# Patient Record
Sex: Male | Born: 1945 | Race: White | Hispanic: No | Marital: Married | Smoking: Never smoker
Health system: Southern US, Community
[De-identification: ages and names within clinical notes are randomized; demographics above are authoritative.]

## PROBLEM LIST (undated history)

## (undated) DIAGNOSIS — I10 Essential (primary) hypertension: Secondary | ICD-10-CM

## (undated) DIAGNOSIS — Z8619 Personal history of other infectious and parasitic diseases: Secondary | ICD-10-CM

## (undated) DIAGNOSIS — H33319 Horseshoe tear of retina without detachment, unspecified eye: Secondary | ICD-10-CM

## (undated) DIAGNOSIS — E785 Hyperlipidemia, unspecified: Secondary | ICD-10-CM

## (undated) DIAGNOSIS — N529 Male erectile dysfunction, unspecified: Secondary | ICD-10-CM

## (undated) DIAGNOSIS — T7840XA Allergy, unspecified, initial encounter: Secondary | ICD-10-CM

## (undated) DIAGNOSIS — H439 Unspecified disorder of vitreous body: Secondary | ICD-10-CM

## (undated) DIAGNOSIS — K219 Gastro-esophageal reflux disease without esophagitis: Secondary | ICD-10-CM

## (undated) HISTORY — PX: OTHER SURGICAL HISTORY: SHX169

## (undated) HISTORY — DX: Horseshoe tear of retina without detachment, unspecified eye: H33.319

## (undated) HISTORY — DX: Personal history of other infectious and parasitic diseases: Z86.19

## (undated) HISTORY — DX: Gastro-esophageal reflux disease without esophagitis: K21.9

## (undated) HISTORY — DX: Male erectile dysfunction, unspecified: N52.9

## (undated) HISTORY — DX: Hyperlipidemia, unspecified: E78.5

## (undated) HISTORY — DX: Allergy, unspecified, initial encounter: T78.40XA

## (undated) HISTORY — PX: EYE SURGERY: SHX253

## (undated) HISTORY — DX: Unspecified disorder of vitreous body: H43.9

## (undated) HISTORY — DX: Essential (primary) hypertension: I10

---

## 2006-12-13 ENCOUNTER — Ambulatory Visit: Payer: Self-pay | Admitting: Internal Medicine

## 2006-12-13 LAB — CONVERTED CEMR LAB
AST: 22 units/L (ref 0–37)
Albumin: 3.9 g/dL (ref 3.5–5.2)
Basophils Relative: 0.7 % (ref 0.0–1.0)
Bilirubin, Direct: 0.1 mg/dL (ref 0.0–0.3)
CO2: 25 meq/L (ref 19–32)
Calcium: 9.1 mg/dL (ref 8.4–10.5)
Cholesterol: 206 mg/dL (ref 0–200)
Eosinophils Absolute: 0.2 10*3/uL (ref 0.0–0.6)
GFR calc non Af Amer: 91 mL/min
HDL: 34.6 mg/dL — ABNORMAL LOW (ref >39.0)
Ketones, ur: NEGATIVE mg/dL
Leukocytes, UA: NEGATIVE
Lymphocytes Relative: 29.9 % (ref 12.0–46.0)
Monocytes Absolute: 0.4 10*3/uL (ref 0.2–0.7)
Monocytes Relative: 5.9 % (ref 3.0–11.0)
Neutro Abs: 4 10*3/uL (ref 1.4–7.7)
Neutrophils Relative %: 61 % (ref 43.0–77.0)
Nitrite: NEGATIVE
PSA: 1.04 ng/mL (ref 0.10–4.00)
Platelets: 253 10*3/uL (ref 150–400)
Sodium: 140 meq/L (ref 135–145)
TSH: 2.2 microintl units/mL (ref 0.35–5.50)
Total CHOL/HDL Ratio: 6
Total Protein: 7.2 g/dL (ref 6.0–8.3)
Triglycerides: 139 mg/dL (ref 0–149)
Urine Glucose: NEGATIVE mg/dL
Urobilinogen, UA: 0.2 (ref 0.0–1.0)
WBC: 6.6 10*3/uL (ref 4.5–10.5)

## 2006-12-16 ENCOUNTER — Ambulatory Visit: Payer: Self-pay | Admitting: Internal Medicine

## 2006-12-16 DIAGNOSIS — J309 Allergic rhinitis, unspecified: Secondary | ICD-10-CM | POA: Insufficient documentation

## 2006-12-16 DIAGNOSIS — I10 Essential (primary) hypertension: Secondary | ICD-10-CM

## 2006-12-16 DIAGNOSIS — R05 Cough: Secondary | ICD-10-CM

## 2006-12-16 DIAGNOSIS — E785 Hyperlipidemia, unspecified: Secondary | ICD-10-CM | POA: Insufficient documentation

## 2006-12-16 DIAGNOSIS — R197 Diarrhea, unspecified: Secondary | ICD-10-CM

## 2006-12-16 DIAGNOSIS — K219 Gastro-esophageal reflux disease without esophagitis: Secondary | ICD-10-CM

## 2006-12-17 ENCOUNTER — Encounter: Payer: Self-pay | Admitting: Internal Medicine

## 2007-01-06 ENCOUNTER — Ambulatory Visit: Payer: Self-pay | Admitting: Internal Medicine

## 2007-02-27 ENCOUNTER — Encounter: Payer: Self-pay | Admitting: Infectious Diseases

## 2007-03-15 ENCOUNTER — Ambulatory Visit: Payer: Self-pay | Admitting: Gastroenterology

## 2007-05-31 ENCOUNTER — Telehealth: Payer: Self-pay | Admitting: Internal Medicine

## 2008-05-30 ENCOUNTER — Ambulatory Visit: Payer: Self-pay | Admitting: Internal Medicine

## 2008-05-30 DIAGNOSIS — E538 Deficiency of other specified B group vitamins: Secondary | ICD-10-CM | POA: Insufficient documentation

## 2008-05-30 DIAGNOSIS — N529 Male erectile dysfunction, unspecified: Secondary | ICD-10-CM | POA: Insufficient documentation

## 2008-05-30 DIAGNOSIS — E559 Vitamin D deficiency, unspecified: Secondary | ICD-10-CM | POA: Insufficient documentation

## 2008-05-30 LAB — CONVERTED CEMR LAB
ALT: 24 units/L (ref 0–53)
AST: 20 units/L (ref 0–37)
Albumin: 4.2 g/dL (ref 3.5–5.2)
Alkaline Phosphatase: 63 units/L (ref 39–117)
BUN: 14 mg/dL (ref 6–23)
Basophils Absolute: 0.1 10*3/uL (ref 0.0–0.1)
Basophils Relative: 0.7 % (ref 0.0–3.0)
Bilirubin Urine: NEGATIVE
Bilirubin, Direct: 0.1 mg/dL (ref 0.0–0.3)
CO2: 29 meq/L (ref 19–32)
Calcium: 9.7 mg/dL (ref 8.4–10.5)
Chloride: 102 meq/L (ref 96–112)
Cholesterol: 210 mg/dL — ABNORMAL HIGH (ref 0–200)
Creatinine, Ser: 0.9 mg/dL (ref 0.4–1.5)
Direct LDL: 152.7 mg/dL
Eosinophils Absolute: 0.2 10*3/uL (ref 0.0–0.7)
Eosinophils Relative: 2.1 % (ref 0.0–5.0)
GFR calc non Af Amer: 90.89 mL/min (ref >60)
Glucose, Bld: 92 mg/dL (ref 70–99)
HCT: 46.5 % (ref 39.0–52.0)
HDL: 37.5 mg/dL — ABNORMAL LOW (ref >39.00)
Hemoglobin, Urine: NEGATIVE
Hemoglobin: 16 g/dL (ref 13.0–17.0)
Ketones, ur: NEGATIVE mg/dL
Leukocytes, UA: NEGATIVE
Lymphocytes Relative: 25.1 % (ref 12.0–46.0)
Lymphs Abs: 1.9 10*3/uL (ref 0.7–4.0)
MCHC: 34.4 g/dL (ref 30.0–36.0)
MCV: 91.5 fL (ref 78.0–100.0)
Monocytes Absolute: 0.4 10*3/uL (ref 0.1–1.0)
Monocytes Relative: 5.5 % (ref 3.0–12.0)
Neutro Abs: 5 10*3/uL (ref 1.4–7.7)
Neutrophils Relative %: 66.6 % (ref 43.0–77.0)
Nitrite: NEGATIVE
PSA: 1.55 ng/mL (ref 0.10–4.00)
Platelets: 269 10*3/uL (ref 150.0–400.0)
Potassium: 4.3 meq/L (ref 3.5–5.1)
RBC: 5.08 M/uL (ref 4.22–5.81)
RDW: 12 % (ref 11.5–14.6)
Sodium: 139 meq/L (ref 135–145)
Specific Gravity, Urine: 1.015 (ref 1.000–1.030)
TSH: 2.72 microintl units/mL (ref 0.35–5.50)
Testosterone: 271.56 ng/dL — ABNORMAL LOW (ref 350.00–890.00)
Total Bilirubin: 1 mg/dL (ref 0.3–1.2)
Total CHOL/HDL Ratio: 6
Total Protein, Urine: NEGATIVE mg/dL
Total Protein: 7.9 g/dL (ref 6.0–8.3)
Triglycerides: 147 mg/dL (ref 0.0–149.0)
Urine Glucose: NEGATIVE mg/dL
Urobilinogen, UA: 0.2 (ref 0.0–1.0)
VLDL: 29.4 mg/dL (ref 0.0–40.0)
Vitamin B-12: 223 pg/mL (ref 211–911)
WBC: 7.6 10*3/uL (ref 4.5–10.5)
pH: 6.5 (ref 5.0–8.0)

## 2008-06-10 LAB — CONVERTED CEMR LAB: Vit D, 25-Hydroxy: 28 ng/mL — ABNORMAL LOW (ref 30–89)

## 2009-09-09 ENCOUNTER — Encounter: Payer: Self-pay | Admitting: Internal Medicine

## 2009-09-18 ENCOUNTER — Ambulatory Visit: Payer: Self-pay | Admitting: Internal Medicine

## 2009-09-18 DIAGNOSIS — L219 Seborrheic dermatitis, unspecified: Secondary | ICD-10-CM | POA: Insufficient documentation

## 2009-09-19 ENCOUNTER — Ambulatory Visit: Payer: Self-pay | Admitting: Internal Medicine

## 2009-09-24 LAB — CONVERTED CEMR LAB
BUN: 11 mg/dL (ref 6–23)
Basophils Absolute: 0 10*3/uL (ref 0.0–0.1)
Bilirubin Urine: NEGATIVE
Cholesterol: 194 mg/dL (ref 0–200)
Creatinine, Ser: 0.8 mg/dL (ref 0.4–1.5)
Eosinophils Absolute: 0.1 10*3/uL (ref 0.0–0.7)
GFR calc non Af Amer: 102.2 mL/min (ref >60)
Glucose, Bld: 84 mg/dL (ref 70–99)
Hemoglobin: 15.1 g/dL (ref 13.0–17.0)
Ketones, ur: NEGATIVE mg/dL
Leukocytes, UA: NEGATIVE
Lymphocytes Relative: 30.3 % (ref 12.0–46.0)
Lymphs Abs: 1.8 10*3/uL (ref 0.7–4.0)
Monocytes Relative: 7.3 % (ref 3.0–12.0)
Neutrophils Relative %: 59.2 % (ref 43.0–77.0)
Nitrite: NEGATIVE
PSA: 1.29 ng/mL (ref 0.10–4.00)
Platelets: 243 10*3/uL (ref 150.0–400.0)
RDW: 13.2 % (ref 11.5–14.6)
Sodium: 141 meq/L (ref 135–145)
Specific Gravity, Urine: 1.025 (ref 1.000–1.030)
Total CHOL/HDL Ratio: 5
Total Protein: 6.6 g/dL (ref 6.0–8.3)
Triglycerides: 99 mg/dL (ref 0.0–149.0)
Urine Glucose: NEGATIVE mg/dL
Urobilinogen, UA: 0.2 (ref 0.0–1.0)
Vitamin B-12: 288 pg/mL (ref 211–911)
pH: 5.5 (ref 5.0–8.0)

## 2009-11-27 ENCOUNTER — Encounter: Payer: Self-pay | Admitting: Internal Medicine

## 2010-02-24 NOTE — Letter (Signed)
Summary: Primary Care Appointment Letter  [facility]  [address] [place], [place] [address]   Phone: [phone]  Fax: [phone]    09/09/2009 MRN: [id number]  Roger Owens [address] [place], [place]  [address]  Dear Roger Owens,   Your Primary Care Physician Tresa Garter MD has indicated that:    ___X____it is time to schedule an appointment with Dr. Posey Rea.  Please call the office.    _______you missed your appointment on______ and need to call and          reschedule.    _______you need to have lab work done.    _______you need to schedule an appointment discuss lab or test results.    _______you need to call to reschedule your appointment that is                       scheduled on _________.     Please call our office as soon as possible. Our phone number is [phone]. Please press option 1. Our office is open 8a-12noon and 1p-5p, Monday through Friday.     Thank you,    [facility]

## 2010-02-24 NOTE — Letter (Signed)
Summary: Immunization/Shot Record  [facility]  [address] [place], [place] [address]   Phone: [phone]  Fax: [phone]     Immunization Record for: Shizuo Julia   Flu Vaccine Consent Questions     Do you have a history of severe allergic reactions to this vaccine? no    Any prior history of allergic reactions to egg and/or gelatin? no    Do you have a sensitivity to the preservative Thimersol? no    Do you have a past history of Guillan-Barre Syndrome? no    Do you currently have an acute febrile illness? no    Have you ever had a severe reaction to latex? no    Vaccine information given and explained to patient? yes    Are you currently pregnant? no    Lot Number:[id number]   Exp Date:07/25/2010   Site Given  Right Deltoid IM Lanier Prude, [facility])  September 18, 2009 10:50 AM

## 2010-02-24 NOTE — Assessment & Plan Note (Signed)
Summary: FU PER FLAG/NWS   Vital Signs:  Patient profile:   65 year old male Height:      70 inches Weight:      207 pounds BMI:     29.81 Temp:     98.2 degrees F oral Pulse rate:   64 / minute Resp:     16 per minute BP sitting:   120 / 80  (left arm) Cuff size:   regular  Vitals Entered By: Lanier Prude, Beverly Gust) (September 18, 2009 10:21 AM) CC: f/u Is Patient Diabetic? No Comments pt is not  taking Welchol or vit b 12.   CC:  f/u.  History of Present Illness: The patient presents for a preventive health examination   Current Medications (verified): 1)  Enalapril Maleate 5 Mg Tabs (Enalapril Maleate) .Marland Kitchen.. 1 By Mouth Qd 2)  Omeprazole 20 Mg Cpdr (Omeprazole) .... 2 A Day 3)  Vitamin D3 1000 Unit  Tabs (Cholecalciferol) .... 2 Once Daily 4)  Aspirin 325 Mg Tabs (Aspirin) .Marland Kitchen.. 1 Qd 5)  Welchol 625 Mg Tabs (Colesevelam Hcl) .Marland Kitchen.. 1-2 As Needed Diarrhea 6)  Cialis 20 Mg Tabs (Tadalafil) .Marland Kitchen.. 1 Tablet Every Other Day As Needed For Erectile Dysfunction 7)  B-12 500 Mcg Tabs (Cyanocobalamin) .Marland Kitchen.. 1 By Mouth Qd 8)  Dhea 50 Mg Tabs (Prasterone (Dhea)) .Marland Kitchen.. 1 By Mouth Qd  Allergies (verified): No Known Drug Allergies  Past History:  Past Medical History: Last updated: 05/30/2008 Hypertension Allergic rhinitis GERD Hyperlipidemia Hep B, recovered RETINAL TEAR 2010 h/o vitreal body detachment Colon/EGD nl Dr Jarold Motto ED and decr libido  Family History: Last updated: 12/16/2006 Family History Breast cancer 1st degree relative <50 Family History of CAD Male 1st degree relative <60 F glyoma 65 y.o.  Social History: Last updated: 12/16/2006 Occupation: Orthopedist Married Never Smoked  Past Surgical History: varicocele surgery R shoulder arthrosc 98 retinal surg for ret tear R eye Dr Luciana Axe  Review of Systems  The patient denies anorexia, fever, weight loss, weight gain, vision loss, decreased hearing, hoarseness, chest pain, syncope, dyspnea on exertion,  peripheral edema, prolonged cough, headaches, hemoptysis, abdominal pain, melena, hematochezia, severe indigestion/heartburn, hematuria, incontinence, genital sores, muscle weakness, suspicious skin lesions, transient blindness, difficulty walking, depression, unusual weight change, abnormal bleeding, enlarged lymph nodes, angioedema, and testicular masses.    Physical Exam  General:  Well-developed,well-nourished,in no acute distress; alert,appropriate and cooperative throughout examination Head:  Normocephalic and atraumatic without obvious abnormalities. No apparent alopecia or balding. Eyes:  No corneal or conjunctival inflammation noted. EOMI. Perrla. Funduscopic exam benign, without hemorrhages, exudates or papilledema. Vision grossly normal. Ears:  External ear exam shows no significant lesions or deformities.  Otoscopic examination reveals clear canals, tympanic membranes are intact bilaterally without bulging, retraction, inflammation or discharge. Hearing is grossly normal bilaterally. Nose:  External nasal examination shows no deformity or inflammation. Nasal mucosa are pink and moist without lesions or exudates. Mouth:  Oral mucosa and oropharynx without lesions or exudates.  Teeth in good repair. Neck:  No deformities, masses, or tenderness noted. Lungs:  Normal respiratory effort, chest expands symmetrically. Lungs are clear to auscultation, no crackles or wheezes. Heart:  Normal rate and regular rhythm. S1 and S2 normal without gallop, murmur, click, rub or other extra sounds. Abdomen:  Bowel sounds positive,abdomen soft and non-tender without masses, organomegaly or hernias noted. Rectal:  pt declined Genitalia:  as above Prostate:  as above Msk:  No deformity or scoliosis noted of thoracic or lumbar spine.   Pulses:  R and L carotid,radial,femoral,dorsalis pedis and posterior tibial pulses are full and equal bilaterally Extremities:  No clubbing, cyanosis, edema, or deformity  noted with normal full range of motion of all joints.   Neurologic:  No cranial nerve deficits noted. Station and gait are normal. Plantar reflexes are down-going bilaterally. DTRs are symmetrical throughout. Sensory, motor and coordinative functions appear intact. Skin:  Intact without suspicious lesions SD on face Cervical Nodes:  No lymphadenopathy noted Inguinal Nodes:  No significant adenopathy Psych:  Cognition and judgment appear intact. Alert and cooperative with normal attention span and concentration. No apparent delusions, illusions, hallucinations   Impression & Recommendations:  Problem # 1:  HEALTH MAINTENANCE EXAM (ICD-V70.0) Assessment New Health and age related issues were discussed. Available screening tests and vaccinations were discussed as well. Healthy life style including good diet and exercise was discussed.  Labs ordered Flu shot given  Problem # 2:  SEBORRHEIC DERMATITIS (ICD-690.10) face Assessment: New Pt declined treatment  Problem # 3:  VITAMIN B12 DEFICIENCY (ICD-266.2) Assessment: Unchanged On the regimen of medicine(s) reflected in the chart   Compliance encouraged  Problem # 4:  VITAMIN D DEFICIENCY (ICD-268.9) Assessment: Unchanged On the regimen of medicine(s) reflected in the chart    Problem # 5:  HYPERLIPIDEMIA (ICD-272.4) Assessment: Unchanged  His updated medication list for this problem includes:    Welchol 625 Mg Tabs (Colesevelam hcl) .Marland Kitchen... 1-2 as needed diarrhea  Problem # 6:  HYPERTENSION (ICD-401.9) Assessment: Unchanged  His updated medication list for this problem includes:    Enalapril Maleate 5 Mg Tabs (Enalapril maleate) .Marland Kitchen... 1 by mouth qd  BP today: 120/80 Prior BP: 116/84 (05/30/2008)  Labs Reviewed: K+: 4.3 (05/30/2008) Creat: : 0.9 (05/30/2008)   Chol: 210 (05/30/2008)   HDL: 37.50 (05/30/2008)   LDL: DEL (12/13/2006)   TG: 147.0 (05/30/2008)  Complete Medication List: 1)  Enalapril Maleate 5 Mg Tabs (Enalapril  maleate) .Marland Kitchen.. 1 by mouth qd 2)  Omeprazole 20 Mg Cpdr (Omeprazole) .... 2 a day 3)  Aspirin 325 Mg Tabs (Aspirin) .Marland Kitchen.. 1 qd 4)  Welchol 625 Mg Tabs (Colesevelam hcl) .Marland Kitchen.. 1-2 as needed diarrhea 5)  Cialis 20 Mg Tabs (Tadalafil) .Marland Kitchen.. 1 tablet every other day as needed for erectile dysfunction 6)  B-12 500 Mcg Tabs (Cyanocobalamin) .Marland Kitchen.. 1 by mouth qd 7)  Dhea 50 Mg Tabs (Prasterone (dhea)) .Marland Kitchen.. 1 by mouth qd 8)  Vitamin D3 1000 Unit Tabs (Cholecalciferol) .... 2 once daily  Other Orders: Admin 1st Vaccine (40981) Flu Vaccine 36yrs + ([address]147)  Patient Instructions: 1)  Please schedule a follow-up appointment in 1 year. 2)  This week: 3)  BMP prior to visit, ICD-9: 4)  Hepatic Panel prior to visit, ICD-9: 5)  Lipid Panel prior to visit, ICD-9: 6)  TSH prior to visit, ICD-9: 7)  CBC w/ Diff prior to visit, ICD-9: 8)  Urine-dip prior to visit, ICD-9: 9)  PSA prior to visit, ICD-9: 10)  Vit B12 266.20 11)  Vit D 268.9    .lbflu    Flu Vaccine Consent Questions     Do you have a history of severe allergic reactions to this vaccine? no    Any prior history of allergic reactions to egg and/or gelatin? no    Do you have a sensitivity to the preservative Thimersol? no    Do you have a past history of Guillan-Barre Syndrome? no    Do you currently have an acute febrile illness? no    Have  you ever had a severe reaction to latex? no    Vaccine information given and explained to patient? yes    Are you currently pregnant? no    Lot Number:[id number]   Exp Date:07/25/2010   Site Given  Right Deltoid IM Lanier Prude, [facility])  September 18, 2009 10:50 AM

## 2010-07-22 ENCOUNTER — Other Ambulatory Visit: Payer: Self-pay | Admitting: Internal Medicine

## 2010-09-06 ENCOUNTER — Other Ambulatory Visit: Payer: Self-pay | Admitting: Internal Medicine

## 2010-11-17 ENCOUNTER — Other Ambulatory Visit: Payer: Self-pay | Admitting: Internal Medicine

## 2011-01-14 ENCOUNTER — Other Ambulatory Visit: Payer: Self-pay | Admitting: Internal Medicine

## 2011-03-03 ENCOUNTER — Other Ambulatory Visit: Payer: Self-pay | Admitting: Internal Medicine

## 2011-04-16 ENCOUNTER — Other Ambulatory Visit: Payer: Self-pay | Admitting: Internal Medicine

## 2011-05-21 ENCOUNTER — Other Ambulatory Visit: Payer: Self-pay | Admitting: Internal Medicine

## 2011-05-22 NOTE — Telephone Encounter (Signed)
Enalapril refill sent to pharmacy #[id number] x no refills as pt is past due for f/u if he is still seeing [place]. Last seen in 2011 and recommended f/u in 1 year. Please call pt to schedule 1 yr f/u if he is still seeing Dr Posey Rea.

## 2011-06-17 NOTE — Telephone Encounter (Signed)
Finally reached the patient after several weeks.  He made a physical appt for July 31.

## 2011-06-30 ENCOUNTER — Telehealth: Payer: Self-pay | Admitting: Internal Medicine

## 2011-06-30 DIAGNOSIS — R509 Fever, unspecified: Secondary | ICD-10-CM

## 2011-06-30 DIAGNOSIS — E538 Deficiency of other specified B group vitamins: Secondary | ICD-10-CM

## 2011-06-30 DIAGNOSIS — M791 Myalgia, unspecified site: Secondary | ICD-10-CM

## 2011-06-30 MED ORDER — TRAMADOL HCL 50 MG PO TABS: 50.0000 mg | ORAL_TABLET | Freq: Two times a day (BID) | ORAL | Status: DC | PRN

## 2011-06-30 NOTE — Telephone Encounter (Signed)
Severe myalgia x 2 d No UTI, resp or GI sx's Fever - low grade if any  Using Tamiflu Declined OV. To ER if worse  Start Tramadol prn, Ibuprofen 600 mg tid Labs tomorrow  I called the pt  Thx

## 2011-06-30 NOTE — Telephone Encounter (Signed)
Caller: Roger Owens/Patient; PCP: Sonda Primes; CB#: [phone]; Call regarding Flu Syptoms; Onset 06/28/11.  States he has "terrible leg and back pain, headache, gut ache, weak".  Febrile/subjective.  Ibuprofen reduces headache; it fluctuates from moderate to severe.  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  States he needs documentation of illness as well as wanting to feel better.  Advised home care for the interim and that infomation is being sent to provider for review.

## 2011-07-01 ENCOUNTER — Encounter: Payer: Self-pay | Admitting: Internal Medicine

## 2011-07-01 ENCOUNTER — Ambulatory Visit (INDEPENDENT_AMBULATORY_CARE_PROVIDER_SITE_OTHER)
Admission: RE | Admit: 2011-07-01 | Discharge: 2011-07-01 | Disposition: A | Discharge status: Home / Self Care | Payer: BC Managed Care – PPO | Source: Ambulatory Visit | Attending: Internal Medicine | Admitting: Internal Medicine

## 2011-07-01 ENCOUNTER — Telehealth: Payer: Self-pay | Admitting: Internal Medicine

## 2011-07-01 ENCOUNTER — Ambulatory Visit (INDEPENDENT_AMBULATORY_CARE_PROVIDER_SITE_OTHER): Payer: BC Managed Care – PPO | Admitting: Internal Medicine

## 2011-07-01 ENCOUNTER — Other Ambulatory Visit (INDEPENDENT_AMBULATORY_CARE_PROVIDER_SITE_OTHER): Payer: BC Managed Care – PPO

## 2011-07-01 VITALS — BP 130/90 | HR 76 | Temp 97.4°F | Resp 16 | Wt 202.0 lb

## 2011-07-01 DIAGNOSIS — Z578 Occupational exposure to other risk factors: Secondary | ICD-10-CM

## 2011-07-01 DIAGNOSIS — R509 Fever, unspecified: Secondary | ICD-10-CM

## 2011-07-01 DIAGNOSIS — M791 Myalgia, unspecified site: Secondary | ICD-10-CM

## 2011-07-01 DIAGNOSIS — E538 Deficiency of other specified B group vitamins: Secondary | ICD-10-CM

## 2011-07-01 DIAGNOSIS — R05 Cough: Secondary | ICD-10-CM

## 2011-07-01 DIAGNOSIS — R197 Diarrhea, unspecified: Secondary | ICD-10-CM

## 2011-07-01 DIAGNOSIS — IMO0001 Reserved for inherently not codable concepts without codable children: Secondary | ICD-10-CM

## 2011-07-01 DIAGNOSIS — E559 Vitamin D deficiency, unspecified: Secondary | ICD-10-CM

## 2011-07-01 DIAGNOSIS — Z7721 Contact with and (suspected) exposure to potentially hazardous body fluids: Secondary | ICD-10-CM

## 2011-07-01 LAB — CBC WITH DIFFERENTIAL/PLATELET
Basophils Relative: 0.3 % (ref 0.0–3.0)
Eosinophils Relative: 0.5 % (ref 0.0–5.0)
HCT: 48.1 % (ref 39.0–52.0)
Lymphs Abs: 0.6 10*3/uL — ABNORMAL LOW (ref 0.7–4.0)
MCHC: 33.3 g/dL (ref 30.0–36.0)
MCV: 92.1 fl (ref 78.0–100.0)
Monocytes Absolute: 0.4 10*3/uL (ref 0.1–1.0)
Neutro Abs: 2.6 10*3/uL (ref 1.4–7.7)
RBC: 5.22 Mil/uL (ref 4.22–5.81)
WBC: 3.7 10*3/uL — ABNORMAL LOW (ref 4.5–10.5)

## 2011-07-01 LAB — URINALYSIS
Bilirubin Urine: NEGATIVE
Ketones, ur: NEGATIVE
Nitrite: NEGATIVE
Total Protein, Urine: NEGATIVE
Urine Glucose: NEGATIVE
pH: 6 (ref 5.0–8.0)

## 2011-07-01 LAB — HEPATIC FUNCTION PANEL
AST: 50 U/L — ABNORMAL HIGH (ref 0–37)
Bilirubin, Direct: 0.2 mg/dL (ref 0.0–0.3)
Total Bilirubin: 1 mg/dL (ref 0.3–1.2)

## 2011-07-01 LAB — BASIC METABOLIC PANEL
BUN: 14 mg/dL (ref 6–23)
Creatinine, Ser: 0.8 mg/dL (ref 0.4–1.5)
GFR: 97.45 mL/min (ref >60.00)
Potassium: 3.6 mEq/L (ref 3.5–5.1)

## 2011-07-01 LAB — CK: Total CK: 82 U/L (ref 7–232)

## 2011-07-01 MED ORDER — AZITHROMYCIN 250 MG PO TABS: ORAL_TABLET | ORAL | Status: AC

## 2011-07-01 NOTE — Assessment & Plan Note (Signed)
6/13 - acute and severe - poss viral. R/o other causes Labs, CXR

## 2011-07-01 NOTE — Assessment & Plan Note (Signed)
CXR

## 2011-07-01 NOTE — Telephone Encounter (Signed)
Roger Owens, please, ask the Lab to release prelim labs avail Thx

## 2011-07-01 NOTE — Progress Notes (Signed)
Patient ID: Roger Owens, male   DOB: 03-30-1945, 65 y.o.   MRN: [id number]

## 2011-07-01 NOTE — Assessment & Plan Note (Signed)
Continue with current prescription therapy as reflected on the Med list.  

## 2011-07-01 NOTE — Progress Notes (Signed)
Subjective:    Patient ID: Roger Owens, male    DOB: [birth year], 66 y.o.   MRN: [id number]  HPI Sick x 3 d with weakness and severe 8/10 buttocks and leg muscles myalgias. He stayed in bed x 2 d. He took 10 Advils and 4-5 T#3 yesterday... Some ST and a little cough today.  No other resp and GI/GU sx's. No appetite...  2 mo ago - he had  a needle stick during surgery - the pt was Hep C (+) No tick bites. He was in [place] 2 wks ago  Wt Readings from Last 3 Encounters:  07/01/11 202 lb (91.627 kg)  09/18/09 207 lb (93.895 kg)  05/30/08 212 lb (96.163 kg)   BP Readings from Last 3 Encounters:  07/01/11 130/90  09/18/09 120/80  05/30/08 116/84      Review of Systems  Constitutional: Positive for fever, diaphoresis and fatigue. Negative for chills, appetite change and unexpected weight change.  HENT: Negative for nosebleeds, congestion, sore throat, rhinorrhea, sneezing, mouth sores, trouble swallowing, neck pain, neck stiffness, dental problem, voice change and postnasal drip.   Eyes: Negative for redness, itching and visual disturbance.  Respiratory: Positive for choking (mild). Negative for cough, chest tightness, shortness of breath, wheezing and stridor.   Cardiovascular: Negative for chest pain, palpitations and leg swelling.  Gastrointestinal: Negative for nausea, diarrhea, blood in stool and abdominal distention.  Genitourinary: Negative for dysuria, urgency, frequency, hematuria, flank pain and difficulty urinating.  Musculoskeletal: Positive for myalgias. Negative for back pain, joint swelling, arthralgias and gait problem.  Skin: Positive for pallor. Negative for rash and wound.  Neurological: Positive for light-headedness. Negative for dizziness, tremors, speech difficulty, weakness, numbness and headaches.  Psychiatric/Behavioral: Negative for suicidal ideas, confusion, sleep disturbance, dysphoric mood and agitation. The patient is not nervous/anxious.        Objective:     Physical Exam  Constitutional: He is oriented to person, place, and time. He appears well-developed and well-nourished. He appears distressed.       Looks tired, clammy a little   HENT:  Head: Normocephalic.  Mouth/Throat: Oropharynx is clear and moist.  Eyes: Conjunctivae are normal. Pupils are equal, round, and reactive to light. No scleral icterus.  Neck: Normal range of motion. No JVD present. No thyromegaly present.  Cardiovascular: Normal rate, regular rhythm, normal heart sounds and intact distal pulses.  Exam reveals no gallop and no friction rub.   No murmur heard. Pulmonary/Chest: Effort normal and breath sounds normal. No respiratory distress. He has no wheezes. He has no rales. He exhibits no tenderness.  Abdominal: Soft. Bowel sounds are normal. He exhibits no distension and no mass. There is no tenderness. There is no rebound and no guarding.  Musculoskeletal: Normal range of motion. He exhibits no edema and no tenderness.  Lymphadenopathy:    He has no cervical adenopathy.  Neurological: He is alert and oriented to person, place, and time. He has normal reflexes. No cranial nerve deficit. He exhibits normal muscle tone. Coordination normal.  Skin: Skin is warm and dry. No rash noted. He is not diaphoretic.  Psychiatric: He has a normal mood and affect. His behavior is normal. Judgment and thought content normal.   Lab Results  Component Value Date   WBC 5.8 09/19/2009   HGB 15.1 09/19/2009   HCT 44.4 09/19/2009   PLT 243.0 09/19/2009   GLUCOSE 84 09/19/2009   CHOL 194 09/19/2009   TRIG 99.0 09/19/2009   HDL 39.00* 09/19/2009  LDLDIRECT 152.7 05/30/2008   LDLCALC 135* 09/19/2009   ALT 19 09/19/2009   AST 16 09/19/2009   NA 141 09/19/2009   K 4.7 09/19/2009   CL 107 09/19/2009   CREATININE 0.8 09/19/2009   BUN 11 09/19/2009   CO2 26 09/19/2009   TSH 2.59 09/19/2009   PSA 1.29 09/19/2009           Assessment & Plan:

## 2011-07-01 NOTE — Telephone Encounter (Signed)
Roger Owens in [facility] lab informed.

## 2011-07-01 NOTE — Assessment & Plan Note (Signed)
6/13 - acute and severe - poss viral. R/o other causes Labs, CXR Tylenol #3 prn

## 2011-07-01 NOTE — Assessment & Plan Note (Signed)
3/13 Hep C (+) patient Repeat serology

## 2011-07-01 NOTE — Assessment & Plan Note (Signed)
On po B12 

## 2011-07-01 NOTE — Assessment & Plan Note (Signed)
Chronic and recurrent Welchol prn

## 2011-07-02 ENCOUNTER — Telehealth: Payer: Self-pay | Admitting: Internal Medicine

## 2011-07-02 LAB — HEPATITIS B SURFACE ANTIBODY,QUALITATIVE: Hep B S Ab: POSITIVE — AB

## 2011-07-02 LAB — HEPATITIS B CORE ANTIBODY, TOTAL: Hep B Core Total Ab: POSITIVE — AB

## 2011-07-02 LAB — HEPATITIS C ANTIBODY: HCV Ab: NEGATIVE

## 2011-07-02 NOTE — Telephone Encounter (Signed)
Misty Stanley, please, inform patient that his Hep C and HIV tests were negative  Please, mail the labs to the patient.

## 2011-07-02 NOTE — Telephone Encounter (Signed)
Pt informed

## 2011-07-07 ENCOUNTER — Telehealth: Payer: Self-pay | Admitting: Internal Medicine

## 2011-07-07 NOTE — Telephone Encounter (Signed)
Misty Stanley, please, inform Dr Alveda Reasons that his Blood cx was negative. Is he well? Thx

## 2011-07-08 NOTE — Telephone Encounter (Signed)
Pt's wife informed. He is better.

## 2011-07-09 ENCOUNTER — Encounter: Payer: Self-pay | Admitting: Internal Medicine

## 2011-08-25 ENCOUNTER — Ambulatory Visit (INDEPENDENT_AMBULATORY_CARE_PROVIDER_SITE_OTHER): Payer: BC Managed Care – PPO | Admitting: Internal Medicine

## 2011-08-25 ENCOUNTER — Encounter: Payer: Self-pay | Admitting: Internal Medicine

## 2011-08-25 VITALS — BP 118/80 | HR 64 | Temp 97.7°F | Resp 16 | Ht 69.0 in | Wt 203.0 lb

## 2011-08-25 DIAGNOSIS — Z0000 Encounter for general adult medical examination without abnormal findings: Secondary | ICD-10-CM | POA: Insufficient documentation

## 2011-08-25 DIAGNOSIS — E538 Deficiency of other specified B group vitamins: Secondary | ICD-10-CM

## 2011-08-25 DIAGNOSIS — Z136 Encounter for screening for cardiovascular disorders: Secondary | ICD-10-CM

## 2011-08-25 DIAGNOSIS — Z23 Encounter for immunization: Secondary | ICD-10-CM

## 2011-08-25 MED ORDER — VITAMIN D 1000 UNITS PO TABS: 1000.0000 [IU] | ORAL_TABLET | Freq: Every day | ORAL | Status: AC

## 2011-08-25 MED ORDER — VITAMIN B-12 1000 MCG SL SUBL: 1.0000 | SUBLINGUAL_TABLET | Freq: Every day | SUBLINGUAL | Status: AC

## 2011-08-25 MED ORDER — OMEPRAZOLE 40 MG PO CPDR: 40.0000 mg | DELAYED_RELEASE_CAPSULE | Freq: Every day | ORAL | Status: DC

## 2011-08-25 MED ORDER — ENALAPRIL MALEATE 5 MG PO TABS: 5.0000 mg | ORAL_TABLET | Freq: Every day | ORAL | Status: DC

## 2011-08-25 NOTE — Assessment & Plan Note (Signed)
Continue with current prescription therapy as reflected on the Med list.  

## 2011-08-25 NOTE — Progress Notes (Signed)
Subjective:    Patient ID: Roger Owens, male    DOB: 1946-01-21, 66 y.o.   MRN: [id number]  HPI  The patient is here for a wellness exam. The patient has been doing well overall without major physical or psychological issues going on lately. The patient needs to address  chronic hypertension that has been well controlled with medicines; to address chronic  hyperlipidemia controlled with diet  Wt Readings from Last 3 Encounters:  08/25/11 203 lb (92.08 kg)  07/01/11 202 lb (91.627 kg)  09/18/09 207 lb (93.895 kg)   BP Readings from Last 3 Encounters:  08/25/11 118/80  07/01/11 130/90  09/18/09 120/80      Review of Systems  Constitutional: Negative for diaphoresis, appetite change, fatigue and unexpected weight change.  HENT: Negative for nosebleeds, congestion, sore throat, sneezing, trouble swallowing and neck pain.   Eyes: Negative for itching and visual disturbance.  Respiratory: Negative for cough.   Cardiovascular: Negative for chest pain, palpitations and leg swelling.  Gastrointestinal: Negative for nausea, diarrhea, blood in stool and abdominal distention.  Genitourinary: Negative for frequency and hematuria.  Musculoskeletal: Negative for back pain, joint swelling and gait problem.  Skin: Negative for rash.  Neurological: Negative for dizziness, tremors, speech difficulty and weakness.  Psychiatric/Behavioral: Negative for suicidal ideas, disturbed wake/sleep cycle, dysphoric mood and agitation. The patient is not nervous/anxious.        Objective:   Physical Exam  Constitutional: He is oriented to person, place, and time. He appears well-developed and well-nourished. No distress.  HENT:  Head: Normocephalic and atraumatic.  Right Ear: External ear normal.  Left Ear: External ear normal.  Nose: Nose normal.  Mouth/Throat: Oropharynx is clear and moist. No oropharyngeal exudate.  Eyes: Conjunctivae and EOM are normal. Pupils are equal, round, and reactive to  light. Right eye exhibits no discharge. Left eye exhibits no discharge. No scleral icterus.  Neck: Normal range of motion. Neck supple. No JVD present. No tracheal deviation present. No thyromegaly present.  Cardiovascular: Normal rate, regular rhythm, normal heart sounds and intact distal pulses.  Exam reveals no gallop and no friction rub.   No murmur heard. Pulmonary/Chest: Effort normal and breath sounds normal. No stridor. No respiratory distress. He has no wheezes. He has no rales. He exhibits no tenderness.  Abdominal: Soft. Bowel sounds are normal. He exhibits no distension and no mass. There is no tenderness. There is no rebound and no guarding.  Genitourinary: Rectum normal, prostate normal and penis normal. Guaiac negative stool. No penile tenderness.  Musculoskeletal: Normal range of motion. He exhibits no edema and no tenderness.  Lymphadenopathy:    He has no cervical adenopathy.  Neurological: He is alert and oriented to person, place, and time. He has normal reflexes. No cranial nerve deficit. He exhibits normal muscle tone. Coordination normal.  Skin: Skin is warm and dry. No rash noted. He is not diaphoretic. No erythema. No pallor.  Psychiatric: He has a normal mood and affect. His behavior is normal. Judgment and thought content normal.   Lab Results  Component Value Date   WBC 3.7* 07/01/2011   HGB 16.0 07/01/2011   HCT 48.1 07/01/2011   PLT 211.0 07/01/2011   GLUCOSE 109* 07/01/2011   CHOL 194 09/19/2009   TRIG 99.0 09/19/2009   HDL 39.00* 09/19/2009   LDLDIRECT 152.7 05/30/2008   LDLCALC 135* 09/19/2009   ALT 61* 07/01/2011   AST 50* 07/01/2011   NA 141 07/01/2011   K 3.6 07/01/2011  CL 104 07/01/2011   CREATININE 0.8 07/01/2011   BUN 14 07/01/2011   CO2 27 07/01/2011   TSH 2.59 09/19/2009   PSA 1.29 09/19/2009          Assessment & Plan:

## 2011-08-25 NOTE — Patient Instructions (Addendum)
Wt Readings from Last 3 Encounters:  08/25/11 203 lb (92.08 kg)  07/01/11 202 lb (91.627 kg)  09/18/09 207 lb (93.895 kg)   BP Readings from Last 3 Encounters:  08/25/11 118/80  07/01/11 130/90  09/18/09 120/80

## 2011-10-08 ENCOUNTER — Other Ambulatory Visit: Payer: Self-pay | Admitting: Internal Medicine

## 2012-02-25 ENCOUNTER — Other Ambulatory Visit: Payer: Self-pay | Admitting: Internal Medicine

## 2012-10-17 ENCOUNTER — Other Ambulatory Visit: Payer: Self-pay | Admitting: Internal Medicine

## 2012-10-19 ENCOUNTER — Telehealth: Payer: Self-pay | Admitting: Internal Medicine

## 2012-10-19 ENCOUNTER — Telehealth: Payer: Self-pay | Admitting: *Deleted

## 2012-10-19 NOTE — Telephone Encounter (Signed)
Message copied by Merrilyn Puma on Thu Oct 19, 2012  3:47 PM ------      Message from: Livingston Diones      Created: Tue Oct 17, 2012  1:40 PM       I left massage for pt to call back to schedule an appt.       ----- Message -----         From: Merrilyn Puma, CMA         Sent: 10/17/2012   1:25 PM           To: Etheleen Sia, Phetcharat Noitamyae            Please call pt- Last OV was 07/2011. I refilled his BP med x 30 days. He will not receive any further refills until seen.       Thanks!!       ------

## 2012-10-19 NOTE — Telephone Encounter (Signed)
Called pt to schedule an appt with Dr. Macario Golds , per MD request. Spoke with pt's spouse, she stated that she will tell him to call to make an appt once he get back in town.

## 2012-11-19 ENCOUNTER — Other Ambulatory Visit: Payer: Self-pay | Admitting: Internal Medicine

## 2012-12-26 ENCOUNTER — Other Ambulatory Visit: Payer: Self-pay | Admitting: *Deleted

## 2012-12-26 MED ORDER — OMEPRAZOLE 40 MG PO CPDR: 40.0000 mg | DELAYED_RELEASE_CAPSULE | Freq: Every day | ORAL | Status: DC

## 2012-12-28 ENCOUNTER — Encounter (HOSPITAL_COMMUNITY): Payer: Self-pay | Admitting: Emergency Medicine

## 2012-12-28 ENCOUNTER — Emergency Department (HOSPITAL_COMMUNITY): Payer: Medicare Other

## 2012-12-28 ENCOUNTER — Emergency Department (HOSPITAL_COMMUNITY)
Admission: EM | Admit: 2012-12-28 | Discharge: 2012-12-28 | Disposition: A | Discharge status: Home / Self Care | Payer: Medicare Other | Attending: Emergency Medicine | Admitting: Emergency Medicine

## 2012-12-28 DIAGNOSIS — Z87448 Personal history of other diseases of urinary system: Secondary | ICD-10-CM | POA: Insufficient documentation

## 2012-12-28 DIAGNOSIS — Z79899 Other long term (current) drug therapy: Secondary | ICD-10-CM | POA: Insufficient documentation

## 2012-12-28 DIAGNOSIS — Z7982 Long term (current) use of aspirin: Secondary | ICD-10-CM | POA: Insufficient documentation

## 2012-12-28 DIAGNOSIS — Z8619 Personal history of other infectious and parasitic diseases: Secondary | ICD-10-CM | POA: Insufficient documentation

## 2012-12-28 DIAGNOSIS — K219 Gastro-esophageal reflux disease without esophagitis: Secondary | ICD-10-CM | POA: Insufficient documentation

## 2012-12-28 DIAGNOSIS — Y9389 Activity, other specified: Secondary | ICD-10-CM | POA: Insufficient documentation

## 2012-12-28 DIAGNOSIS — Z862 Personal history of diseases of the blood and blood-forming organs and certain disorders involving the immune mechanism: Secondary | ICD-10-CM | POA: Insufficient documentation

## 2012-12-28 DIAGNOSIS — Z8639 Personal history of other endocrine, nutritional and metabolic disease: Secondary | ICD-10-CM | POA: Insufficient documentation

## 2012-12-28 DIAGNOSIS — S43016A Anterior dislocation of unspecified humerus, initial encounter: Secondary | ICD-10-CM | POA: Insufficient documentation

## 2012-12-28 DIAGNOSIS — S43014A Anterior dislocation of right humerus, initial encounter: Secondary | ICD-10-CM

## 2012-12-28 DIAGNOSIS — X58XXXA Exposure to other specified factors, initial encounter: Secondary | ICD-10-CM | POA: Insufficient documentation

## 2012-12-28 DIAGNOSIS — Y92009 Unspecified place in unspecified non-institutional (private) residence as the place of occurrence of the external cause: Secondary | ICD-10-CM | POA: Insufficient documentation

## 2012-12-28 DIAGNOSIS — Z8669 Personal history of other diseases of the nervous system and sense organs: Secondary | ICD-10-CM | POA: Insufficient documentation

## 2012-12-28 DIAGNOSIS — I10 Essential (primary) hypertension: Secondary | ICD-10-CM | POA: Insufficient documentation

## 2012-12-28 MED ORDER — ONDANSETRON HCL 4 MG/2ML IJ SOLN
4.0000 mg | Freq: Once | INTRAMUSCULAR | Status: AC
  Administered 2012-12-28: 4 mg via INTRAVENOUS

## 2012-12-28 MED ORDER — OXYCODONE-ACETAMINOPHEN 5-325 MG PO TABS: 1.0000 | ORAL_TABLET | ORAL | Status: AC | PRN

## 2012-12-28 MED ORDER — ONDANSETRON HCL 4 MG/2ML IJ SOLN
INTRAMUSCULAR | Status: AC
  Filled 2012-12-28: qty 2

## 2012-12-28 MED ORDER — PROPOFOL 10 MG/ML IV EMUL
INTRAVENOUS | Status: AC | PRN
  Administered 2012-12-28: 5 mL via INTRAVENOUS

## 2012-12-28 MED ORDER — PROPOFOL 10 MG/ML IV BOLUS
0.5000 mg/kg | Freq: Once | INTRAVENOUS | Status: AC
  Administered 2012-12-28: 46 mg via INTRAVENOUS
  Filled 2012-12-28: qty 20

## 2012-12-28 MED ORDER — FENTANYL CITRATE 0.05 MG/ML IJ SOLN
100.0000 ug | Freq: Once | INTRAMUSCULAR | Status: AC
  Administered 2012-12-28: 100 ug via INTRAVENOUS
  Filled 2012-12-28: qty 2

## 2012-12-28 MED ORDER — FENTANYL CITRATE 0.05 MG/ML IJ SOLN
50.0000 ug | Freq: Once | INTRAMUSCULAR | Status: AC
  Administered 2012-12-28: 50 ug via INTRAVENOUS
  Filled 2012-12-28: qty 2

## 2012-12-28 NOTE — ED Provider Notes (Signed)
CSN: [id number]     Arrival date & time 12/28/12  0046 History   First MD Initiated Contact with Patient 12/28/12 0103     Chief Complaint  Patient presents with  . Dislocation    Right shoulder   (Consider location/radiation/quality/duration/timing/severity/associated sxs/prior Treatment) The history is provided by the patient.   67 year old male dislocated his right shoulder. He rolled over in bed and his right arm was extended and he thought it slipped out of place. He had dislocated it about 3 years ago. He is complaining of severe pain in that right shoulder. He was brought in by ambulance and had received 150 mg of fentanyl and route and is still complaining of severe pain which she rates at 10/10. There's no numbness or tingling. He last ate or drank at 8:30 PM.  Past Medical History  Diagnosis Date  . Hypertension   . History of hepatitis B     recovered  . Allergy   . GERD (gastroesophageal reflux disease)   . Hyperlipidemia   . Retinal tear   . Vitreous body disorder   . Erectile dysfunction    Past Surgical History  Procedure Laterality Date  . Eye surgery     Family History  Problem Relation Age of Onset  . Cancer Mother     breast  . Heart disease Mother     CAD  . Cancer Father 76    glioma   History  Substance Use Topics  . Smoking status: Never Smoker   . Smokeless tobacco: Not on file  . Alcohol Use: 1.8 oz/week    3 Glasses of wine per week    Review of Systems  All other systems reviewed and are negative.    Allergies  Review of patient's allergies indicates no known allergies.  Home Medications   Current Outpatient Rx  Name  Route  Sig  Dispense  Refill  . aspirin 325 MG EC tablet   Oral   Take 325 mg by mouth daily.         . Cyanocobalamin (VITAMIN B-12) 1000 MCG SUBL   Sublingual   Place 1 tablet (1,000 mcg total) under the tongue daily.   100 tablet   3   . enalapril (VASOTEC) 5 MG tablet      TAKE 1 TABLET BY MOUTH  DAILY   30 tablet   11   . omeprazole (PRILOSEC) 40 MG capsule   Oral   Take 1 capsule (40 mg total) by mouth daily.   30 capsule   5    BP 153/86  Pulse 63  Temp(Src) 97.7 F (36.5 C) (Oral)  Resp 24  SpO2 97% Physical Exam  Nursing note and vitals reviewed.  67 year old male, who is in obvious pain, but is in no acute distress. Vital signs are significant for hypertension with blood pressure 153/86, and tachypnea with respiratory rate of 24. Oxygen saturation is 97%, which is normal. Head is normocephalic and atraumatic. PERRLA, EOMI. Oropharynx is clear. Neck is nontender and supple without adenopathy or JVD. Back is nontender and there is no CVA tenderness. Lungs are clear without rales, wheezes, or rhonchi. Chest is nontender. Heart has regular rate and rhythm without murmur. Abdomen is soft, flat, nontender without masses or hepatosplenomegaly and peristalsis is normoactive. Extremities: Right arm is abducted and there is pain with any movement. There is a deformity consistent with anterior dislocation. Distal neurovascular exam is intact with strong pulses, prompt capillary refill, normal sensation.Marland Kitchen  Skin is warm and dry without rash. Neurologic: Mental status is normal, cranial nerves are intact, there are no motor or sensory deficits.  ED Course  Reduction of dislocation Date/Time: 12/28/2012 2:10 AM Performed by: Dione Booze Authorized by: Preston Fleeting, Haiven Nardone Consent: Verbal consent obtained. written consent obtained. Risks and benefits: risks, benefits and alternatives were discussed Consent given by: patient Patient understanding: patient states understanding of the procedure being performed Patient consent: the patient's understanding of the procedure matches consent given Procedure consent: procedure consent matches procedure scheduled Relevant documents: relevant documents present and verified Test results: test results available and properly labeled Site marked:  the operative site was marked Imaging studies: imaging studies available Required items: required blood products, implants, devices, and special equipment available Patient identity confirmed: verbally with patient and arm band Time out: Immediately prior to procedure a "time out" was called to verify the correct patient, procedure, equipment, support staff and site/side marked as required. Local anesthesia used: no Patient sedated: yes Sedation type: moderate (conscious) sedation Sedatives: propofol Analgesia: fentanyl Sedation start date/time: 12/28/2012 2:10 AM Sedation end date/time: 12/28/2012 2:35 AM Vitals: Vital signs were monitored during sedation. Patient tolerance: Patient tolerated the procedure well with no immediate complications. Comments: Right anterior shoulder dislocation reduced by manipulation without difficulty. Shoulder immobilizer is applied following reduction.   (including critical care time) Imaging Review Dg Shoulder Right  12/28/2012   CLINICAL DATA:  Postreduction  EXAM: RIGHT SHOULDER - 2+ VIEW  COMPARISON:  12/28/2012  FINDINGS: The humeral head now projects over the glenoid. No appreciable fracture. Linear right lung opacity, favor atelectasis.  IMPRESSION: Interval right shoulder reduction.  Linear right lung opacity, favor atelectasis.   Electronically Signed   By: Jearld Lesch M.D.   On: 12/28/2012 03:17   Dg Shoulder Right  12/28/2012   CLINICAL DATA:  Dislocation  EXAM: RIGHT SHOULDER - 2+ VIEW  COMPARISON:  None.  FINDINGS: The right humeral head is positioned inferomedial and anterior relative to the glenoid, in keeping with an anterior shoulder dislocation. Hypo aerated right lung with mild atelectasis. No displaced fracture.  IMPRESSION: Anterior right shoulder dislocation.   Electronically Signed   By: Jearld Lesch M.D.   On: 12/28/2012 01:42   Procedural sedation Performed by: [email or web address],UEAVW Consent: Verbal consent obtained. Risks and benefits:  risks, benefits and alternatives were discussed Required items: required blood products, implants, devices, and special equipment available Patient identity confirmed: arm band and provided demographic data Time out: Immediately prior to procedure a "time out" was called to verify the correct patient, procedure, equipment, support staff and site/side marked as required.  Sedation type: moderate (conscious) sedation NPO time confirmed and considedered  Sedatives: PROPOFOL  Physician Time at Bedside: 35 minutes  Vitals: Vital signs were monitored during sedation. Cardiac Monitor, pulse oximeter Patient tolerance: Patient tolerated the procedure well with no immediate complications. Comments: Pt with uneventful recovered. Returned to pre-procedural sedation baseline    MDM   1. Closed anterior dislocation of right shoulder, initial encounter    Recurrent right shoulder dislocation. X-ray be obtained to confirm a dislocation and you'll need reduction under procedural sedation.  Postreduction x-ray confirms adequate reduction. He is sent home with orthopedic followup and prescriptions given for oxycodone-acetaminophen for pain.  Dione Booze, MD 12/28/12 [phone]

## 2012-12-28 NOTE — ED Notes (Signed)
Pt is a retired Careers adviser, pt was laying in bed at home and rolled over dislocating his right shoulder

## 2012-12-29 ENCOUNTER — Other Ambulatory Visit: Payer: Self-pay | Admitting: Internal Medicine

## 2013-05-17 IMAGING — CR DG CHEST 2V
2 series · 2 of 2 positions shown · non-contrast
Comparison: 12/16/2006.

CLINICAL DATA: Fever.  Cough.  Nausea.

CHEST - 2 VIEW

[view not recorded (1 of 2)]
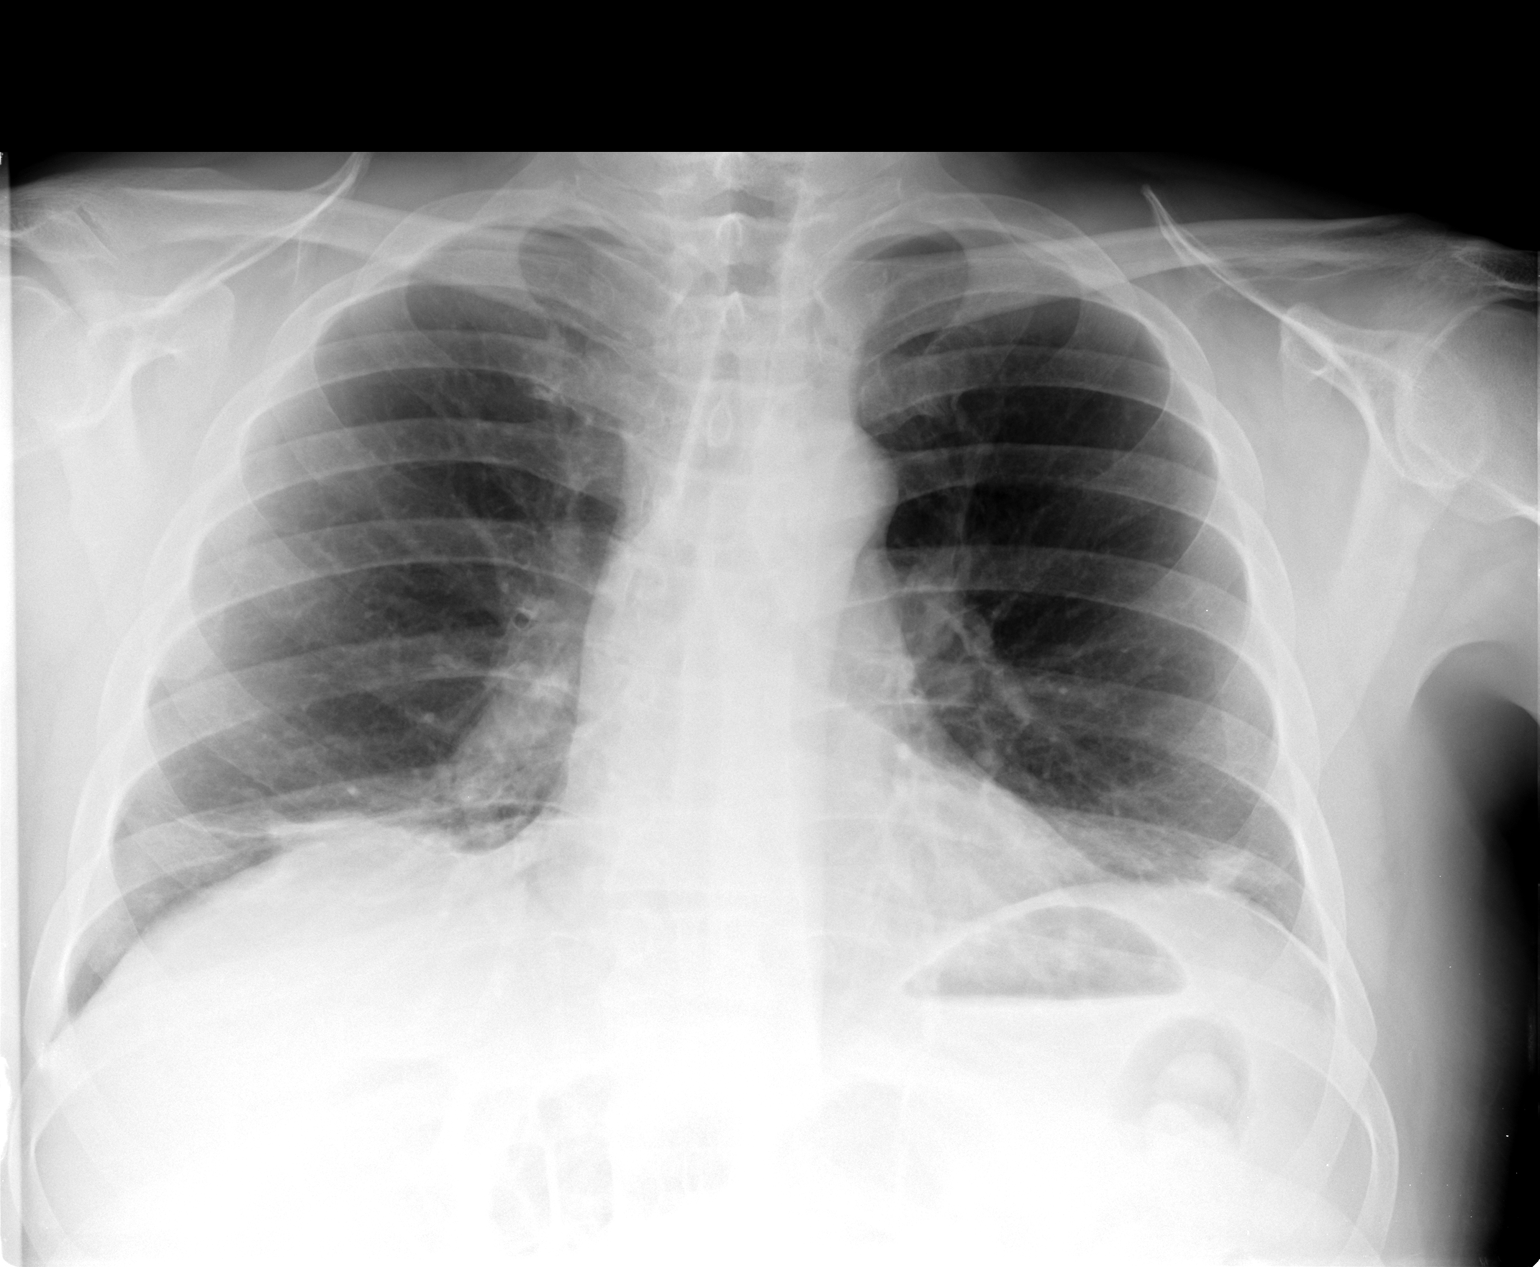

[view not recorded (2 of 2)]
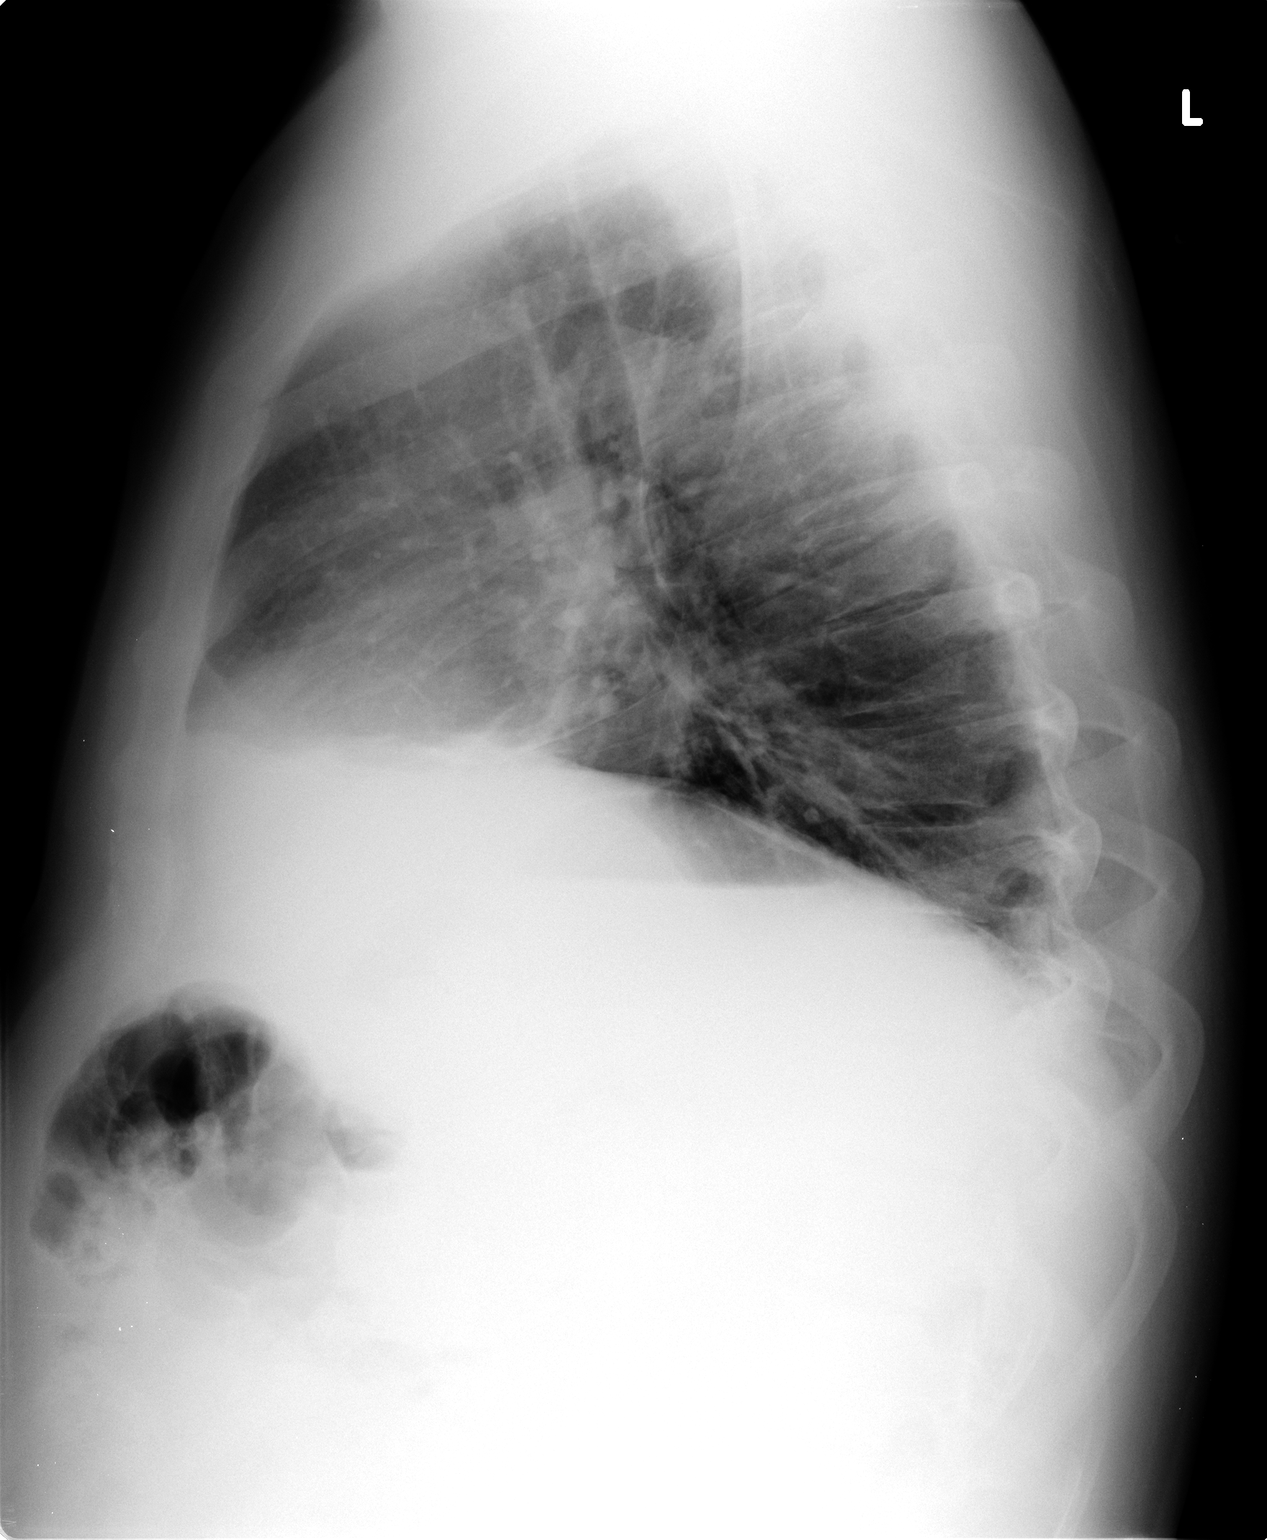

[2 of 2 positions shown; findings below may reference images not displayed]

FINDINGS: The heart remains normal in size.  Poor inspiration with
bibasilar linear density.  Mild scoliosis.
IMPRESSION: Poor inspiration with mild bibasilar atelectasis.

## 2013-12-24 ENCOUNTER — Other Ambulatory Visit: Payer: Self-pay | Admitting: Internal Medicine

## 2014-06-21 ENCOUNTER — Other Ambulatory Visit: Payer: Self-pay | Admitting: Internal Medicine

## 2014-06-28 ENCOUNTER — Ambulatory Visit (INDEPENDENT_AMBULATORY_CARE_PROVIDER_SITE_OTHER): Payer: Medicare Other | Admitting: Internal Medicine

## 2014-06-28 ENCOUNTER — Encounter: Payer: Self-pay | Admitting: Internal Medicine

## 2014-06-28 VITALS — BP 138/82 | HR 77 | Ht 69.5 in | Wt 205.0 lb

## 2014-06-28 DIAGNOSIS — Z23 Encounter for immunization: Secondary | ICD-10-CM | POA: Diagnosis not present

## 2014-06-28 DIAGNOSIS — Z0000 Encounter for general adult medical examination without abnormal findings: Secondary | ICD-10-CM

## 2014-06-28 DIAGNOSIS — I10 Essential (primary) hypertension: Secondary | ICD-10-CM

## 2014-06-28 DIAGNOSIS — E538 Deficiency of other specified B group vitamins: Secondary | ICD-10-CM

## 2014-06-28 MED ORDER — ENALAPRIL MALEATE 5 MG PO TABS: 5.0000 mg | ORAL_TABLET | Freq: Every day | ORAL | Status: DC

## 2014-06-28 NOTE — Assessment & Plan Note (Signed)
On B12 

## 2014-06-28 NOTE — Progress Notes (Signed)
Pre visit review using our clinic review tool, if applicable. No additional management support is needed unless otherwise documented below in the visit note. 

## 2014-06-28 NOTE — Patient Instructions (Signed)
Preventive Care for Adults A healthy lifestyle and preventive care can promote health and wellness. Preventive health guidelines for men include the following key practices:  A routine yearly physical is a good way to check with your health care provider about your health and preventative screening. It is a chance to share any concerns and updates on your health and to receive a thorough exam.  Visit your dentist for a routine exam and preventative care every 6 months. Brush your teeth twice a day and floss once a day. Good oral hygiene prevents tooth decay and gum disease.  The frequency of eye exams is based on your age, health, family medical history, use of contact lenses, and other factors. Follow your health care provider's recommendations for frequency of eye exams.  Eat a healthy diet. Foods such as vegetables, fruits, whole grains, low-fat dairy products, and lean protein foods contain the nutrients you need without too many calories. Decrease your intake of foods high in solid fats, added sugars, and salt. Eat the right amount of calories for you.Get information about a proper diet from your health care provider, if necessary.  Regular physical exercise is one of the most important things you can do for your health. Most adults should get at least 150 minutes of moderate-intensity exercise (any activity that increases your heart rate and causes you to sweat) each week. In addition, most adults need muscle-strengthening exercises on 2 or more days a week.  Maintain a healthy weight. The body mass index (BMI) is a screening tool to identify possible weight problems. It provides an estimate of body fat based on height and weight. Your health care provider can find your BMI and can help you achieve or maintain a healthy weight.For adults 20 years and older:  A BMI below 18.5 is considered underweight.  A BMI of 18.5 to 24.9 is normal.  A BMI of 25 to 29.9 is considered overweight.  A BMI  of 30 and above is considered obese.  Maintain normal blood lipids and cholesterol levels by exercising and minimizing your intake of saturated fat. Eat a balanced diet with plenty of fruit and vegetables. Blood tests for lipids and cholesterol should begin at age 28 and be repeated every 5 years. If your lipid or cholesterol levels are high, you are over 50, or you are at high risk for heart disease, you may need your cholesterol levels checked more frequently.Ongoing high lipid and cholesterol levels should be treated with medicines if diet and exercise are not working.  If you smoke, find out from your health care provider how to quit. If you do not use tobacco, do not start.  Lung cancer screening is recommended for adults aged 57-80 years who are at high risk for developing lung cancer because of a history of smoking. A yearly low-dose CT scan of the lungs is recommended for people who have at least a 30-pack-year history of smoking and are a current smoker or have quit within the past 15 years. A pack year of smoking is smoking an average of 1 pack of cigarettes a day for 1 year (for example: 1 pack a day for 30 years or 2 packs a day for 15 years). Yearly screening should continue until the smoker has stopped smoking for at least 15 years. Yearly screening should be stopped for people who develop a health problem that would prevent them from having lung cancer treatment.  If you choose to drink alcohol, do not have more than  2 drinks per day. One drink is considered to be 12 ounces (355 mL) of beer, 5 ounces (148 mL) of wine, or 1.5 ounces (44 mL) of liquor.  Avoid use of street drugs. Do not share needles with anyone. Ask for help if you need support or instructions about stopping the use of drugs.  High blood pressure causes heart disease and increases the risk of stroke. Your blood pressure should be checked at least every 1-2 years. Ongoing high blood pressure should be treated with  medicines, if weight loss and exercise are not effective.  If you are 45-79 years old, ask your health care provider if you should take aspirin to prevent heart disease.  Diabetes screening involves taking a blood sample to check your fasting blood sugar level. This should be done once every 3 years, after age 45, if you are within normal weight and without risk factors for diabetes. Testing should be considered at a younger age or be carried out more frequently if you are overweight and have at least 1 risk factor for diabetes.  Colorectal cancer can be detected and often prevented. Most routine colorectal cancer screening begins at the age of 50 and continues through age 75. However, your health care provider may recommend screening at an earlier age if you have risk factors for colon cancer. On a yearly basis, your health care provider may provide home test kits to check for hidden blood in the stool. Use of a small camera at the end of a tube to directly examine the colon (sigmoidoscopy or colonoscopy) can detect the earliest forms of colorectal cancer. Talk to your health care provider about this at age 50, when routine screening begins. Direct exam of the colon should be repeated every 5-10 years through age 75, unless early forms of precancerous polyps or small growths are found.  People who are at an increased risk for hepatitis B should be screened for this virus. You are considered at high risk for hepatitis B if:  You were born in a country where hepatitis B occurs often. Talk with your health care provider about which countries are considered high risk.  Your parents were born in a high-risk country and you have not received a shot to protect against hepatitis B (hepatitis B vaccine).  You have HIV or AIDS.  You use needles to inject street drugs.  You live with, or have sex with, someone who has hepatitis B.  You are a man who has sex with other men (MSM).  You get hemodialysis  treatment.  You take certain medicines for conditions such as cancer, organ transplantation, and autoimmune conditions.  Hepatitis C blood testing is recommended for all people born from 1945 through 1965 and any individual with known risks for hepatitis C.  Practice safe sex. Use condoms and avoid high-risk sexual practices to reduce the spread of sexually transmitted infections (STIs). STIs include gonorrhea, chlamydia, syphilis, trichomonas, herpes, HPV, and human immunodeficiency virus (HIV). Herpes, HIV, and HPV are viral illnesses that have no cure. They can result in disability, cancer, and death.  If you are at risk of being infected with HIV, it is recommended that you take a prescription medicine daily to prevent HIV infection. This is called preexposure prophylaxis (PrEP). You are considered at risk if:  You are a man who has sex with other men (MSM) and have other risk factors.  You are a heterosexual man, are sexually active, and are at increased risk for HIV infection.    You take drugs by injection.  You are sexually active with a partner who has HIV.  Talk with your health care provider about whether you are at high risk of being infected with HIV. If you choose to begin PrEP, you should first be tested for HIV. You should then be tested every 3 months for as long as you are taking PrEP.  A one-time screening for abdominal aortic aneurysm (AAA) and surgical repair of large AAAs by ultrasound are recommended for men ages 32 to 67 years who are current or former smokers.  Healthy men should no longer receive prostate-specific antigen (PSA) blood tests as part of routine cancer screening. Talk with your health care provider about prostate cancer screening.  Testicular cancer screening is not recommended for adult males who have no symptoms. Screening includes self-exam, a health care provider exam, and other screening tests. Consult with your health care provider about any symptoms  you have or any concerns you have about testicular cancer.  Use sunscreen. Apply sunscreen liberally and repeatedly throughout the day. You should seek shade when your shadow is shorter than you. Protect yourself by wearing long sleeves, pants, a wide-brimmed hat, and sunglasses year round, whenever you are outdoors.  Once a month, do a whole-body skin exam, using a mirror to look at the skin on your back. Tell your health care provider about new moles, moles that have irregular borders, moles that are larger than a pencil eraser, or moles that have changed in shape or color.  Stay current with required vaccines (immunizations).  Influenza vaccine. All adults should be immunized every year.  Tetanus, diphtheria, and acellular pertussis (Td, Tdap) vaccine. An adult who has not previously received Tdap or who does not know his vaccine status should receive 1 dose of Tdap. This initial dose should be followed by tetanus and diphtheria toxoids (Td) booster doses every 10 years. Adults with an unknown or incomplete history of completing a 3-dose immunization series with Td-containing vaccines should begin or complete a primary immunization series including a Tdap dose. Adults should receive a Td booster every 10 years.  Varicella vaccine. An adult without evidence of immunity to varicella should receive 2 doses or a second dose if he has previously received 1 dose.  Human papillomavirus (HPV) vaccine. Males aged 68-21 years who have not received the vaccine previously should receive the 3-dose series. Males aged 22-26 years may be immunized. Immunization is recommended through the age of 6 years for any male who has sex with males and did not get any or all doses earlier. Immunization is recommended for any person with an immunocompromised condition through the age of 49 years if he did not get any or all doses earlier. During the 3-dose series, the second dose should be obtained 4-8 weeks after the first  dose. The third dose should be obtained 24 weeks after the first dose and 16 weeks after the second dose.  Zoster vaccine. One dose is recommended for adults aged 50 years or older unless certain conditions are present.  Measles, mumps, and rubella (MMR) vaccine. Adults born before [birth year] generally are considered immune to measles and mumps. Adults born in [birth year] or later should have 1 or more doses of MMR vaccine unless there is a contraindication to the vaccine or there is laboratory evidence of immunity to each of the three diseases. A routine second dose of MMR vaccine should be obtained at least 28 days after the first dose for students attending postsecondary  schools, health care workers, or international travelers. People who received inactivated measles vaccine or an unknown type of measles vaccine during 1963-1967 should receive 2 doses of MMR vaccine. People who received inactivated mumps vaccine or an unknown type of mumps vaccine before 1979 and are at high risk for mumps infection should consider immunization with 2 doses of MMR vaccine. Unvaccinated health care workers born before 1957 who lack laboratory evidence of measles, mumps, or rubella immunity or laboratory confirmation of disease should consider measles and mumps immunization with 2 doses of MMR vaccine or rubella immunization with 1 dose of MMR vaccine.  Pneumococcal 13-valent conjugate (PCV13) vaccine. When indicated, a person who is uncertain of his immunization history and has no record of immunization should receive the PCV13 vaccine. An adult aged 19 years or older who has certain medical conditions and has not been previously immunized should receive 1 dose of PCV13 vaccine. This PCV13 should be followed with a dose of pneumococcal polysaccharide (PPSV23) vaccine. The PPSV23 vaccine dose should be obtained at least 8 weeks after the dose of PCV13 vaccine. An adult aged 19 years or older who has certain medical conditions and  previously received 1 or more doses of PPSV23 vaccine should receive 1 dose of PCV13. The PCV13 vaccine dose should be obtained 1 or more years after the last PPSV23 vaccine dose.  Pneumococcal polysaccharide (PPSV23) vaccine. When PCV13 is also indicated, PCV13 should be obtained first. All adults aged 65 years and older should be immunized. An adult younger than age 65 years who has certain medical conditions should be immunized. Any person who resides in a nursing home or long-term care facility should be immunized. An adult smoker should be immunized. People with an immunocompromised condition and certain other conditions should receive both PCV13 and PPSV23 vaccines. People with human immunodeficiency virus (HIV) infection should be immunized as soon as possible after diagnosis. Immunization during chemotherapy or radiation therapy should be avoided. Routine use of PPSV23 vaccine is not recommended for American Indians, [place] Natives, or people younger than 65 years unless there are medical conditions that require PPSV23 vaccine. When indicated, people who have unknown immunization and have no record of immunization should receive PPSV23 vaccine. One-time revaccination 5 years after the first dose of PPSV23 is recommended for people aged 19-64 years who have chronic kidney failure, nephrotic syndrome, asplenia, or immunocompromised conditions. People who received 1-2 doses of PPSV23 before age 65 years should receive another dose of PPSV23 vaccine at age 65 years or later if at least 5 years have passed since the previous dose. Doses of PPSV23 are not needed for people immunized with PPSV23 at or after age 65 years.  Meningococcal vaccine. Adults with asplenia or persistent complement component deficiencies should receive 2 doses of quadrivalent meningococcal conjugate (MenACWY-D) vaccine. The doses should be obtained at least 2 months apart. Microbiologists working with certain meningococcal bacteria,  military recruits, people at risk during an outbreak, and people who travel to or live in countries with a high rate of meningitis should be immunized. A first-year college student up through age 21 years who is living in a residence hall should receive a dose if he did not receive a dose on or after his 16th birthday. Adults who have certain high-risk conditions should receive one or more doses of vaccine.  Hepatitis A vaccine. Adults who wish to be protected from this disease, have certain high-risk conditions, work with hepatitis A-infected animals, work in hepatitis A research labs, or   travel to or work in countries with a high rate of hepatitis A should be immunized. Adults who were previously unvaccinated and who anticipate close contact with an international adoptee during the first 60 days after arrival in the [place] from a country with a high rate of hepatitis A should be immunized.  Hepatitis B vaccine. Adults should be immunized if they wish to be protected from this disease, have certain high-risk conditions, may be exposed to blood or other infectious body fluids, are household contacts or sex partners of hepatitis B positive people, are clients or workers in certain care facilities, or travel to or work in countries with a high rate of hepatitis B.  Haemophilus influenzae type b (Hib) vaccine. A previously unvaccinated person with asplenia or sickle cell disease or having a scheduled splenectomy should receive 1 dose of Hib vaccine. Regardless of previous immunization, a recipient of a hematopoietic stem cell transplant should receive a 3-dose series 6-12 months after his successful transplant. Hib vaccine is not recommended for adults with HIV infection. Preventive Service / Frequency Ages 52 to 17  Blood pressure check.** / Every 1 to 2 years.  Lipid and cholesterol check.** / Every 5 years beginning at age 69.  Hepatitis C blood test.** / For any individual with known risks for  hepatitis C.  Skin self-exam. / Monthly.  Influenza vaccine. / Every year.  Tetanus, diphtheria, and acellular pertussis (Tdap, Td) vaccine.** / Consult your health care provider. 1 dose of Td every 10 years.  Varicella vaccine.** / Consult your health care provider.  HPV vaccine. / 3 doses over 6 months, if 72 or younger.  Measles, mumps, rubella (MMR) vaccine.** / You need at least 1 dose of MMR if you were born in 1957 or later. You may also need a second dose.  Pneumococcal 13-valent conjugate (PCV13) vaccine.** / Consult your health care provider.  Pneumococcal polysaccharide (PPSV23) vaccine.** / 1 to 2 doses if you smoke cigarettes or if you have certain conditions.  Meningococcal vaccine.** / 1 dose if you are age 35 to 60 years and a Market researcher living in a residence hall, or have one of several medical conditions. You may also need additional booster doses.  Hepatitis A vaccine.** / Consult your health care provider.  Hepatitis B vaccine.** / Consult your health care provider.  Haemophilus influenzae type b (Hib) vaccine.** / Consult your health care provider. Ages 35 to 8  Blood pressure check.** / Every 1 to 2 years.  Lipid and cholesterol check.** / Every 5 years beginning at age 57.  Lung cancer screening. / Every year if you are aged 44-80 years and have a 30-pack-year history of smoking and currently smoke or have quit within the past 15 years. Yearly screening is stopped once you have quit smoking for at least 15 years or develop a health problem that would prevent you from having lung cancer treatment.  Fecal occult blood test (FOBT) of stool. / Every year beginning at age 55 and continuing until age 73. You may not have to do this test if you get a colonoscopy every 10 years.  Flexible sigmoidoscopy** or colonoscopy.** / Every 5 years for a flexible sigmoidoscopy or every 10 years for a colonoscopy beginning at age 28 and continuing until age  1.  Hepatitis C blood test.** / For all people born from [birth year] through 1965 and any individual with known risks for hepatitis C.  Skin self-exam. / Monthly.  Influenza vaccine. / Every  year.  Tetanus, diphtheria, and acellular pertussis (Tdap/Td) vaccine.** / Consult your health care provider. 1 dose of Td every 10 years.  Varicella vaccine.** / Consult your health care provider.  Zoster vaccine.** / 1 dose for adults aged 29 years or older.  Measles, mumps, rubella (MMR) vaccine.** / You need at least 1 dose of MMR if you were born in 1957 or later. You may also need a second dose.  Pneumococcal 13-valent conjugate (PCV13) vaccine.** / Consult your health care provider.  Pneumococcal polysaccharide (PPSV23) vaccine.** / 1 to 2 doses if you smoke cigarettes or if you have certain conditions.  Meningococcal vaccine.** / Consult your health care provider.  Hepatitis A vaccine.** / Consult your health care provider.  Hepatitis B vaccine.** / Consult your health care provider.  Haemophilus influenzae type b (Hib) vaccine.** / Consult your health care provider. Ages 30 and over  Blood pressure check.** / Every 1 to 2 years.  Lipid and cholesterol check.**/ Every 5 years beginning at age 72.  Lung cancer screening. / Every year if you are aged 52-80 years and have a 30-pack-year history of smoking and currently smoke or have quit within the past 15 years. Yearly screening is stopped once you have quit smoking for at least 15 years or develop a health problem that would prevent you from having lung cancer treatment.  Fecal occult blood test (FOBT) of stool. / Every year beginning at age 74 and continuing until age 53. You may not have to do this test if you get a colonoscopy every 10 years.  Flexible sigmoidoscopy** or colonoscopy.** / Every 5 years for a flexible sigmoidoscopy or every 10 years for a colonoscopy beginning at age 14 and continuing until age 67.  Hepatitis C blood  test.** / For all people born from [birth year] through 1965 and any individual with known risks for hepatitis C.  Abdominal aortic aneurysm (AAA) screening.** / A one-time screening for ages 69 to 90 years who are current or former smokers.  Skin self-exam. / Monthly.  Influenza vaccine. / Every year.  Tetanus, diphtheria, and acellular pertussis (Tdap/Td) vaccine.** / 1 dose of Td every 10 years.  Varicella vaccine.** / Consult your health care provider.  Zoster vaccine.** / 1 dose for adults aged 66 years or older.  Pneumococcal 13-valent conjugate (PCV13) vaccine.** / Consult your health care provider.  Pneumococcal polysaccharide (PPSV23) vaccine.** / 1 dose for all adults aged 68 years and older.  Meningococcal vaccine.** / Consult your health care provider.  Hepatitis A vaccine.** / Consult your health care provider.  Hepatitis B vaccine.** / Consult your health care provider.  Haemophilus influenzae type b (Hib) vaccine.** / Consult your health care provider. **Family history and personal history of risk and conditions may change your health care provider's recommendations. Document Released: 03/09/2001 Document Revised: 01/16/2013 Document Reviewed: 06/08/2010 [facility] Patient Information 2015 [place], [place]. This information is not intended to replace advice given to you by your health care provider. Make sure you discuss any questions you have with your health care provider.

## 2014-06-28 NOTE — Progress Notes (Signed)
Subjective:    Patient ID: Roger Owens, male    DOB: Apr 11, 1945, 69 y.o.   MRN: [id number]  HPI  The patient is here for a wellness exam. Kathlene NovemberMike had a dislocated R shoulder Dec 2014. S/p high grade atypia mole back. The patient needs to address  chronic hypertension that has been well controlled with medicines; to address chronic  hyperlipidemia controlled with medicines as well; and to address type 2 chronic diabetes, controlled with medical treatment and diet.    Review of Systems  Constitutional: Negative for appetite change, fatigue and unexpected weight change.  HENT: Negative for congestion, nosebleeds, sneezing, sore throat and trouble swallowing.   Eyes: Negative for itching and visual disturbance.  Respiratory: Negative for cough.   Cardiovascular: Negative for chest pain, palpitations and leg swelling.  Gastrointestinal: Negative for nausea, diarrhea, blood in stool and abdominal distention.  Genitourinary: Negative for frequency and hematuria.  Musculoskeletal: Negative for back pain, joint swelling, gait problem and neck pain.  Skin: Negative for rash.  Neurological: Negative for dizziness, tremors, speech difficulty and weakness.  Psychiatric/Behavioral: Negative for sleep disturbance, dysphoric mood and agitation. The patient is not nervous/anxious.        Objective:   Physical Exam  Constitutional: He is oriented to person, place, and time. He appears well-developed and well-nourished. No distress.  HENT:  Head: Normocephalic and atraumatic.  Right Ear: External ear normal.  Left Ear: External ear normal.  Nose: Nose normal.  Mouth/Throat: Oropharynx is clear and moist. No oropharyngeal exudate.  Eyes: Conjunctivae and EOM are normal. Pupils are equal, round, and reactive to light. Right eye exhibits no discharge. Left eye exhibits no discharge. No scleral icterus.  Neck: Normal range of motion. Neck supple. No JVD present. No tracheal deviation present. No  thyromegaly present.  Cardiovascular: Normal rate, regular rhythm, normal heart sounds and intact distal pulses.  Exam reveals no gallop and no friction rub.   No murmur heard. Pulmonary/Chest: Effort normal and breath sounds normal. No stridor. No respiratory distress. He has no wheezes. He has no rales. He exhibits no tenderness.  Abdominal: Soft. Bowel sounds are normal. He exhibits no distension and no mass. There is no tenderness. There is no rebound and no guarding.  Genitourinary: Rectum normal, prostate normal and penis normal. Guaiac negative stool. No penile tenderness.  Musculoskeletal: Normal range of motion. He exhibits no edema or tenderness.  Lymphadenopathy:    He has no cervical adenopathy.  Neurological: He is alert and oriented to person, place, and time. He has normal reflexes. No cranial nerve deficit. He exhibits normal muscle tone. Coordination normal.  Skin: Skin is warm and dry. No rash noted. He is not diaphoretic. No erythema. No pallor.  Psychiatric: He has a normal mood and affect. His behavior is normal. Judgment and thought content normal.    Lab Results  Component Value Date   WBC 3.7* 07/01/2011   HGB 16.0 07/01/2011   HCT 48.1 07/01/2011   PLT 211.0 07/01/2011   GLUCOSE 109* 07/01/2011   CHOL 194 09/19/2009   TRIG 99.0 09/19/2009   HDL 39.00* 09/19/2009   LDLDIRECT 152.7 05/30/2008   LDLCALC 135* 09/19/2009   ALT 61* 07/01/2011   AST 50* 07/01/2011   NA 141 07/01/2011   K 3.6 07/01/2011   CL 104 07/01/2011   CREATININE 0.8 07/01/2011   BUN 14 07/01/2011   CO2 27 07/01/2011   TSH 2.59 09/19/2009   PSA 1.29 09/19/2009  Assessment & Plan:

## 2014-06-30 NOTE — Assessment & Plan Note (Signed)

## 2014-07-01 ENCOUNTER — Other Ambulatory Visit: Payer: Medicare Other

## 2014-07-19 ENCOUNTER — Telehealth: Payer: Self-pay | Admitting: *Deleted

## 2014-07-19 DIAGNOSIS — E538 Deficiency of other specified B group vitamins: Secondary | ICD-10-CM

## 2014-07-19 DIAGNOSIS — E785 Hyperlipidemia, unspecified: Secondary | ICD-10-CM

## 2014-07-19 DIAGNOSIS — Z125 Encounter for screening for malignant neoplasm of prostate: Secondary | ICD-10-CM

## 2014-07-19 DIAGNOSIS — I10 Essential (primary) hypertension: Secondary | ICD-10-CM

## 2014-07-19 DIAGNOSIS — Z0000 Encounter for general adult medical examination without abnormal findings: Secondary | ICD-10-CM

## 2014-07-19 NOTE — Telephone Encounter (Signed)
Pt requesting Wellness labs . Labs ordered. Pt informed

## 2014-07-22 ENCOUNTER — Other Ambulatory Visit (INDEPENDENT_AMBULATORY_CARE_PROVIDER_SITE_OTHER): Payer: Medicare Other

## 2014-07-22 DIAGNOSIS — Z0000 Encounter for general adult medical examination without abnormal findings: Secondary | ICD-10-CM

## 2014-07-22 DIAGNOSIS — E538 Deficiency of other specified B group vitamins: Secondary | ICD-10-CM

## 2014-07-22 DIAGNOSIS — Z125 Encounter for screening for malignant neoplasm of prostate: Secondary | ICD-10-CM

## 2014-07-22 DIAGNOSIS — I10 Essential (primary) hypertension: Secondary | ICD-10-CM

## 2014-07-22 DIAGNOSIS — E785 Hyperlipidemia, unspecified: Secondary | ICD-10-CM | POA: Diagnosis not present

## 2014-07-22 LAB — HEPATIC FUNCTION PANEL
ALBUMIN: 4.4 g/dL (ref 3.5–5.2)
ALK PHOS: 54 U/L (ref 39–117)
ALT: 15 U/L (ref 0–53)
AST: 15 U/L (ref 0–37)
Bilirubin, Direct: 0.1 mg/dL (ref 0.0–0.3)
Total Bilirubin: 0.8 mg/dL (ref 0.2–1.2)
Total Protein: 7.1 g/dL (ref 6.0–8.3)

## 2014-07-22 LAB — URINALYSIS, ROUTINE W REFLEX MICROSCOPIC
BILIRUBIN URINE: NEGATIVE
Hgb urine dipstick: NEGATIVE
Ketones, ur: NEGATIVE
LEUKOCYTES UA: NEGATIVE
NITRITE: NEGATIVE
RBC / HPF: NONE SEEN (ref 0–?)
Specific Gravity, Urine: 1.01 (ref 1.000–1.030)
TOTAL PROTEIN, URINE-UPE24: NEGATIVE
URINE GLUCOSE: NEGATIVE
UROBILINOGEN UA: 0.2 (ref 0.0–1.0)
pH: 6 (ref 5.0–8.0)

## 2014-07-22 LAB — TSH: TSH: 2.62 u[IU]/mL (ref 0.35–4.50)

## 2014-07-22 LAB — CBC WITH DIFFERENTIAL/PLATELET
Basophils Absolute: 0.1 10*3/uL (ref 0.0–0.1)
Basophils Relative: 0.8 % (ref 0.0–3.0)
Eosinophils Absolute: 0.1 10*3/uL (ref 0.0–0.7)
Eosinophils Relative: 1.7 % (ref 0.0–5.0)
HEMATOCRIT: 46.2 % (ref 39.0–52.0)
HEMOGLOBIN: 15.6 g/dL (ref 13.0–17.0)
LYMPHS ABS: 1.9 10*3/uL (ref 0.7–4.0)
Lymphocytes Relative: 31.4 % (ref 12.0–46.0)
MCHC: 33.8 g/dL (ref 30.0–36.0)
MCV: 90.4 fl (ref 78.0–100.0)
MONOS PCT: 5.8 % (ref 3.0–12.0)
Monocytes Absolute: 0.3 10*3/uL (ref 0.1–1.0)
NEUTROS ABS: 3.6 10*3/uL (ref 1.4–7.7)
Neutrophils Relative %: 60.3 % (ref 43.0–77.0)
Platelets: 258 10*3/uL (ref 150.0–400.0)
RBC: 5.11 Mil/uL (ref 4.22–5.81)
RDW: 13.4 % (ref 11.5–15.5)
WBC: 6 10*3/uL (ref 4.0–10.5)

## 2014-07-22 LAB — VITAMIN B12: Vitamin B-12: 643 pg/mL (ref 211–911)

## 2014-07-22 LAB — LIPID PANEL
CHOLESTEROL: 196 mg/dL (ref 0–200)
HDL: 41.1 mg/dL (ref >39.00)
LDL Cholesterol: 131 mg/dL — ABNORMAL HIGH (ref 0–99)
NonHDL: 154.9
TRIGLYCERIDES: 120 mg/dL (ref 0.0–149.0)
Total CHOL/HDL Ratio: 5
VLDL: 24 mg/dL (ref 0.0–40.0)

## 2014-07-22 LAB — BASIC METABOLIC PANEL
BUN: 15 mg/dL (ref 6–23)
CALCIUM: 9.3 mg/dL (ref 8.4–10.5)
CO2: 28 meq/L (ref 19–32)
Chloride: 103 mEq/L (ref 96–112)
Creatinine, Ser: 0.92 mg/dL (ref 0.40–1.50)
GFR: 86.93 mL/min (ref >60.00)
GLUCOSE: 97 mg/dL (ref 70–99)
POTASSIUM: 4.4 meq/L (ref 3.5–5.1)
SODIUM: 138 meq/L (ref 135–145)

## 2014-07-22 LAB — PSA: PSA: 2.58 ng/mL (ref 0.10–4.00)

## 2014-07-23 ENCOUNTER — Other Ambulatory Visit: Payer: Self-pay | Admitting: Internal Medicine

## 2014-07-23 DIAGNOSIS — R972 Elevated prostate specific antigen [PSA]: Secondary | ICD-10-CM

## 2014-11-14 IMAGING — CR DG SHOULDER 2+V*R*
2 series · 2 of 2 positions shown · non-contrast
Comparison: None.

CLINICAL DATA: Dislocation

EXAM:
RIGHT SHOULDER - 2+ VIEW

[view not recorded (1 of 2)]
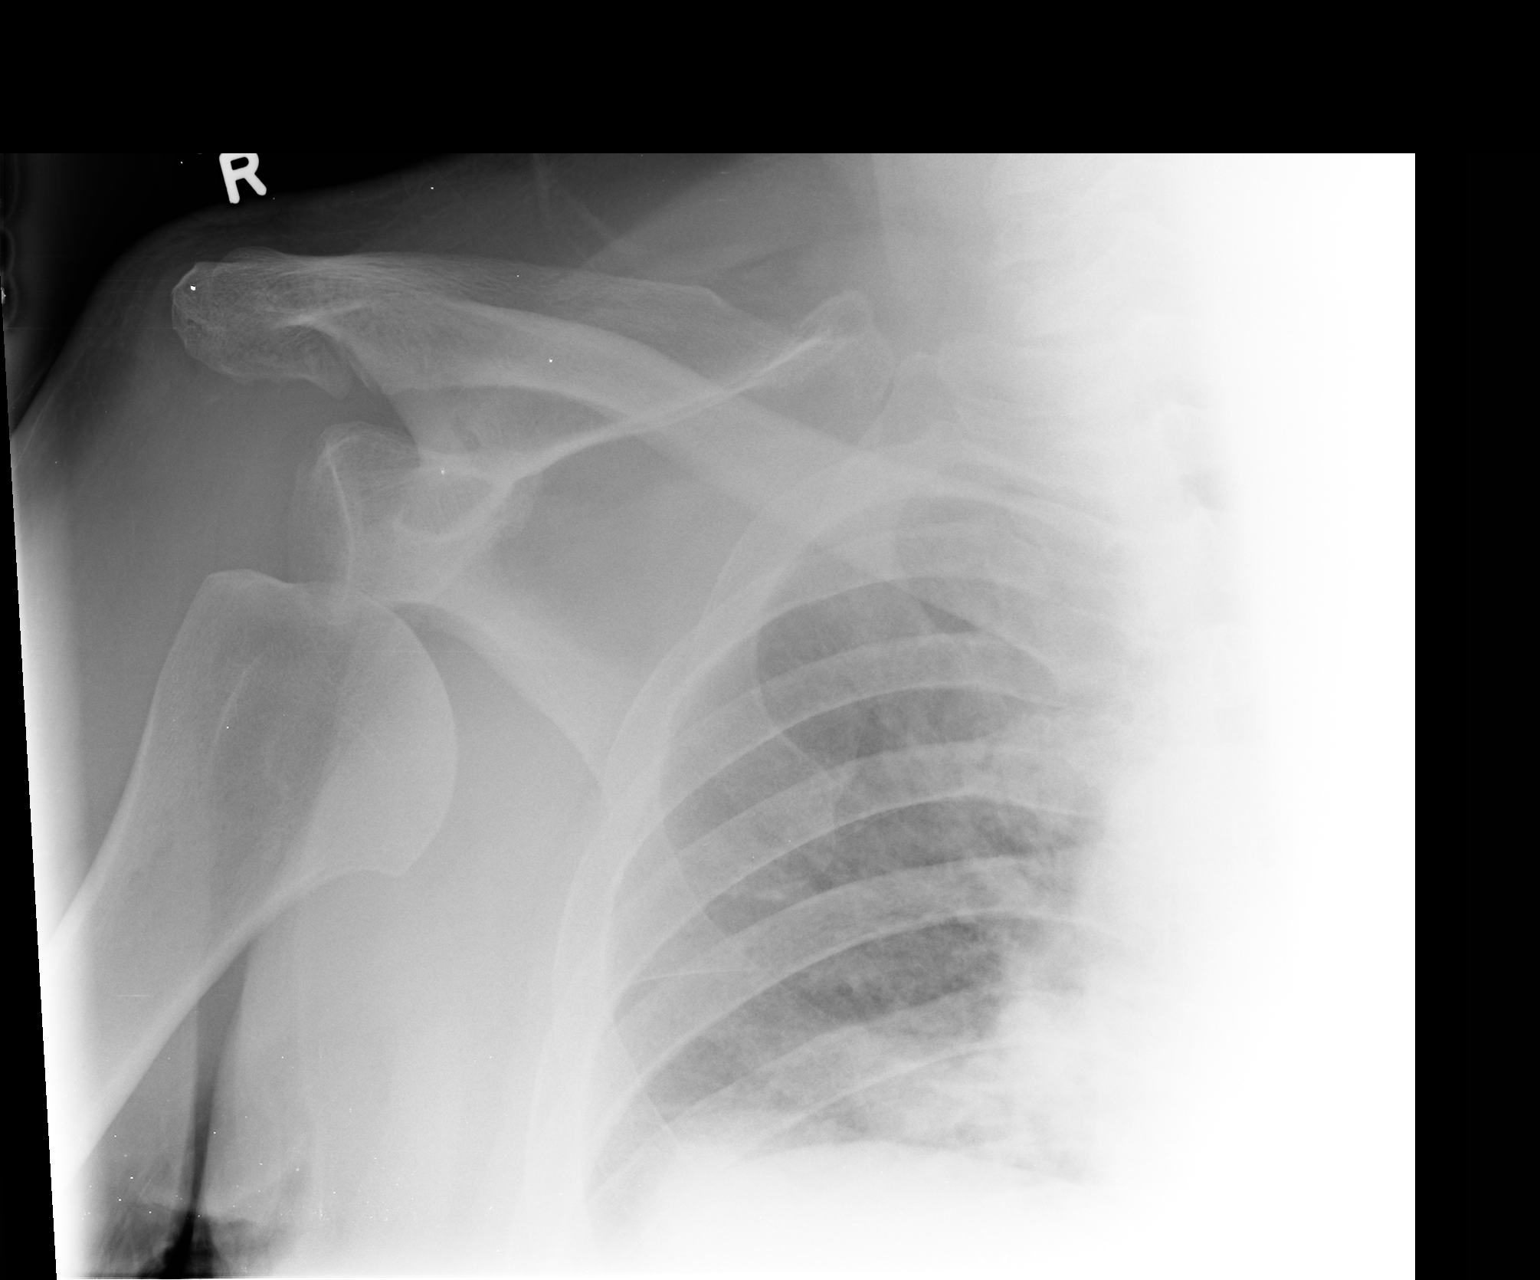

[view not recorded (2 of 2)]
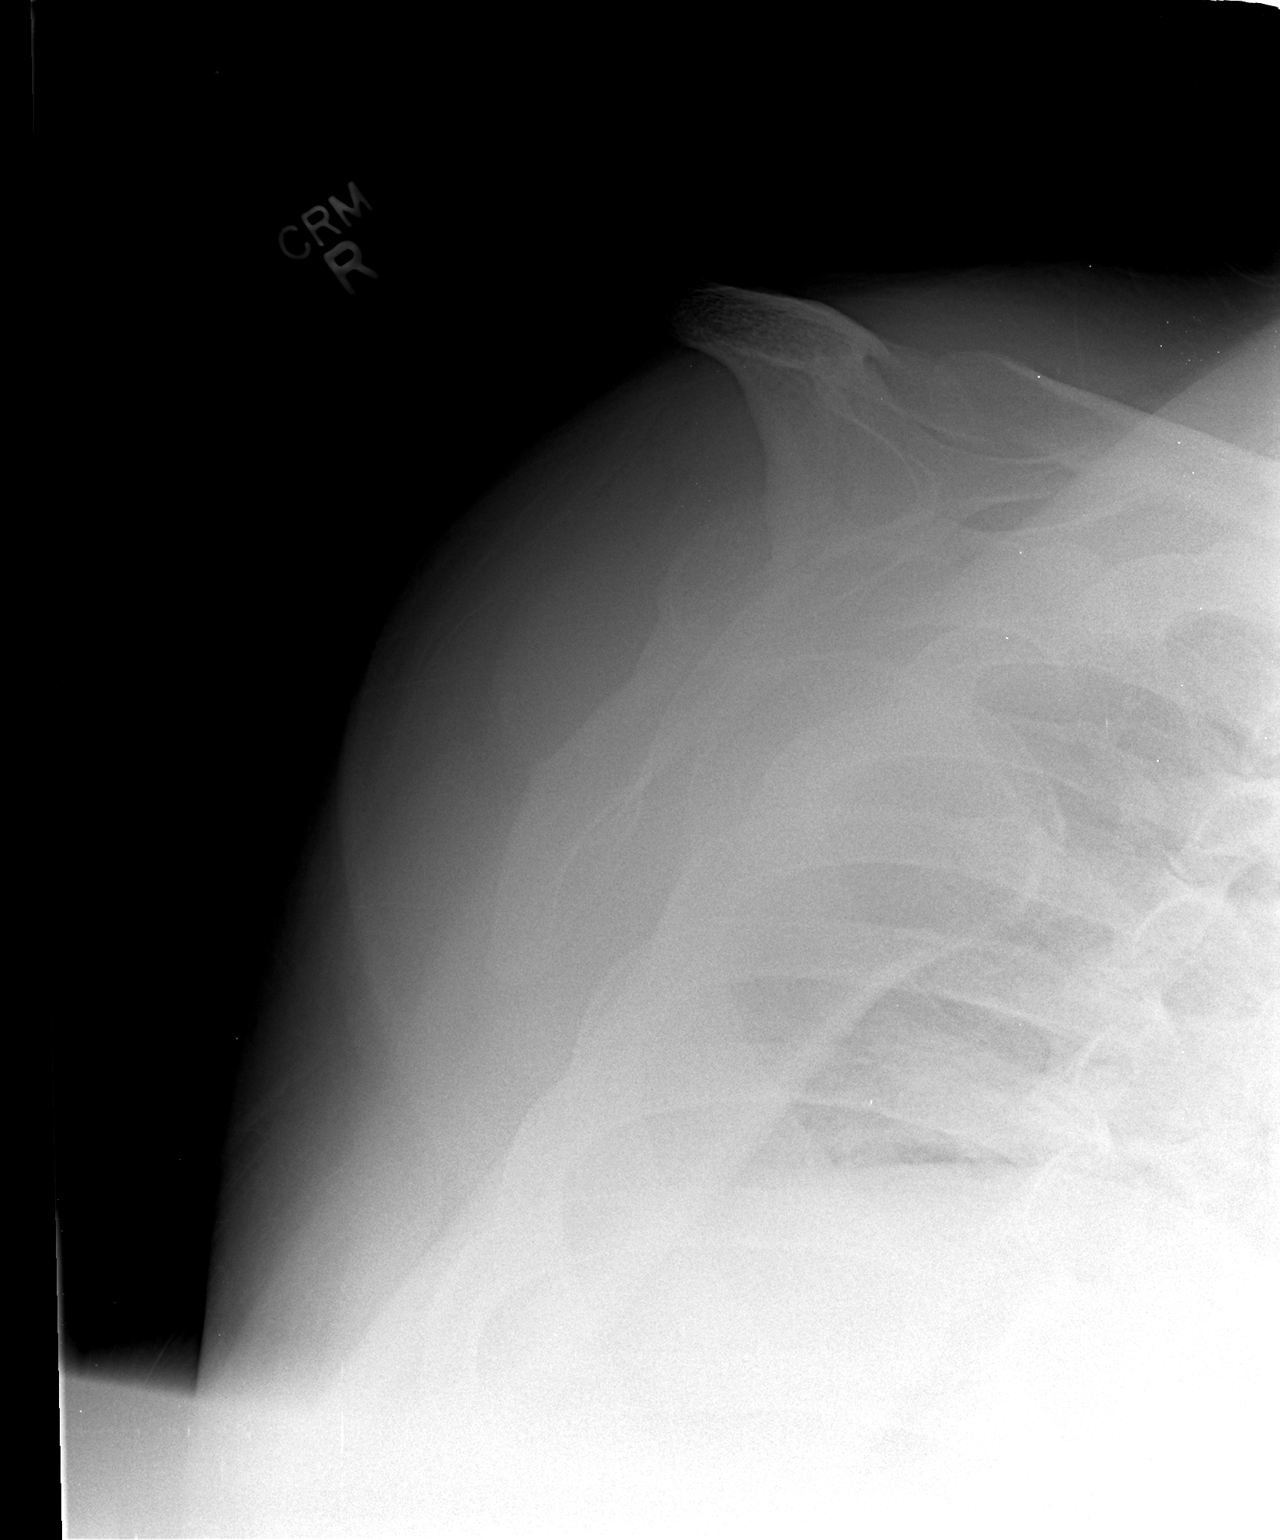

[2 of 2 positions shown; findings below may reference images not displayed]

FINDINGS: The right humeral head is positioned inferomedial and anterior
relative to the glenoid, in keeping with an anterior shoulder
dislocation. Hypo aerated right lung with mild atelectasis. No
displaced fracture.
IMPRESSION: Anterior right shoulder dislocation.

## 2015-01-22 ENCOUNTER — Other Ambulatory Visit: Payer: Medicare Other

## 2015-01-22 DIAGNOSIS — R972 Elevated prostate specific antigen [PSA]: Secondary | ICD-10-CM

## 2015-01-23 LAB — PSA, TOTAL AND FREE
PSA, Free Pct: 26 % (ref >25)
PSA, Free: 0.45 ng/mL
PSA: 1.73 ng/mL (ref <=4.00)

## 2015-01-28 ENCOUNTER — Telehealth: Payer: Self-pay

## 2015-01-28 NOTE — Telephone Encounter (Signed)
Called and left patient a message to give us a call back. Patient needs results of lab work. All PSA and free PSA are normal. You can give patient results or send them back to me to do so.   Corrine/CMA

## 2015-02-10 ENCOUNTER — Telehealth: Payer: Self-pay | Admitting: Internal Medicine

## 2015-02-10 MED ORDER — TADALAFIL 20 MG PO TABS: 10.0000 mg | ORAL_TABLET | Freq: Every day | ORAL | Status: AC | PRN

## 2015-02-10 NOTE — Telephone Encounter (Signed)
Pt is calling in for prescription Cialis. He says you have prescribed this before for him.  Pharmacy is [facility] on [address] He can be reached at [phone]

## 2015-02-10 NOTE — Telephone Encounter (Signed)
Cialis is not on active or inactive med list. Please advise ok to fill Cialis? What dose/sig, etc.?

## 2015-02-10 NOTE — Telephone Encounter (Signed)
OK. Thx

## 2015-02-11 NOTE — Telephone Encounter (Signed)
Notified pt md sent Cialis to [facility]...[name]

## 2015-06-24 ENCOUNTER — Other Ambulatory Visit: Payer: Self-pay | Admitting: *Deleted

## 2015-06-24 MED ORDER — ENALAPRIL MALEATE 5 MG PO TABS: 5.0000 mg | ORAL_TABLET | Freq: Every day | ORAL | Status: DC

## 2015-09-22 ENCOUNTER — Other Ambulatory Visit: Payer: Self-pay | Admitting: Internal Medicine

## 2015-10-02 ENCOUNTER — Other Ambulatory Visit: Payer: Self-pay | Admitting: Internal Medicine

## 2015-12-29 ENCOUNTER — Other Ambulatory Visit: Payer: Self-pay | Admitting: *Deleted

## 2015-12-29 MED ORDER — ENALAPRIL MALEATE 5 MG PO TABS: 5.0000 mg | ORAL_TABLET | Freq: Every day | ORAL | 0 refills | Status: AC

## 2016-04-01 ENCOUNTER — Other Ambulatory Visit: Payer: Self-pay | Admitting: *Deleted

## 2017-04-02 ENCOUNTER — Encounter: Payer: Self-pay | Admitting: Gastroenterology

## 2017-07-02 ENCOUNTER — Inpatient Hospital Stay
Admit: 2017-07-02 | Discharge: 2017-07-02 | Disposition: A | Discharge status: Home / Self Care | Payer: MEDICARE | Source: Home / Self Care

## 2017-07-02 DIAGNOSIS — R269 Unspecified abnormalities of gait and mobility: Secondary | ICD-10-CM

## 2017-07-02 DIAGNOSIS — H539 Unspecified visual disturbance: Secondary | ICD-10-CM

## 2017-07-02 NOTE — ED Provider Notes
[facility]  Emergency Department Service Report    Gordon Cooper 72 y.o. male , presents with Vision Changes      Triage   Arrived on 07/02/2017 at 8:13 AM   Arrived by BLS [13] ([facility] [id number])    ED Triage Vitals [07/02/17 0820]   Temp Temp Source BP Heart Rate Resp SpO2 O2 Device Pain Score Weight   37.1 ???C (98.8 ???F) Oral 130/82 78 16 95 % None (Room air) One 93 kg (205 lb)       Pre hospital care:       No Known Allergies    History   Patient presents with:    Vision Changes: BIBA from home, pt c/o diplopia (now resolved) and balance difficulties when woke up this morning around 700, now resolving. Pt states he was out late and took an [place] to sleep around 1115, which he does not often take and is much later to take when he does for him. He is an orthopedic spine surgeon. Initially he was concerned that it may be a stroke, but is now feeling better and feels it is likely due to the Leon medication. No h/o CVA.            Illness    The current episode started today. The onset is undetermined. The problem occurs continuously. The problem has been resolved. The problem is mild. Relieved by: time. Nothing aggravates the symptoms. Pertinent negatives include no fever and no headaches.            Past Medical History:   Diagnosis Date   ??? Hepatitis B    ??? Hypertension         History reviewed. No pertinent surgical history.     Past Family History   family history is not on file.     Past Social History   he reports that he has never smoked. He does not have any smokeless tobacco history on file. He reports that he drinks alcohol. His drug and sexual activity histories are not on file.     Review of Systems   Constitutional: Negative for fever.   Respiratory: Negative for shortness of breath.    Cardiovascular: Negative for chest pain and leg swelling.   Neurological: Positive for dizziness and speech difficulty. Negative for seizures, syncope, facial asymmetry, numbness and headaches. All other systems reviewed and are negative.      Physical Exam   Physical Exam   Constitutional: He is oriented to person, place, and time. He appears well-developed and well-nourished. No distress.   HENT:   Head: Normocephalic and atraumatic.   Eyes: Pupils are equal, round, and reactive to light. Conjunctivae and EOM are normal.   Neck: Normal range of motion. Neck supple. No JVD present.   No TTP spinal processes   Cardiovascular: Normal rate, regular rhythm, normal heart sounds and intact distal pulses.  Exam reveals no gallop and no friction rub.    No murmur heard.  Pulmonary/Chest: Effort normal and breath sounds normal. No respiratory distress. He has no wheezes. He has no rales.   Abdominal: Soft. He exhibits no distension. There is no tenderness. There is no rebound and no guarding.   Genitourinary:   Genitourinary Comments: No CVA tenderness   Musculoskeletal: Normal range of motion. He exhibits no tenderness.   Secondary trauma exam otherwise negative.   Neurological: He is alert and oriented to person, place, and time. No cranial nerve deficit or sensory  deficit. He exhibits normal muscle tone. Coordination normal.   Skin: Skin is warm and dry. No rash noted.   No pedal edema   Psychiatric:   No SI or HI.  Adequate care plan.  No e/o abuse.     Nursing note and vitals reviewed.      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 However, he is now confident it was only a medication side effect and declines those tests at this time. Not c/w TIA, CVA. Patient discharged with wife who is in agreement.    Data Reviewed/Counseling: I have reviwed the patient's vital signs, nursing notes, and other relevant tests/information. I had a detailed discussion regarding the historical points, exam findings, and any diagnostic results supporting the discharge diagnosis. I also discussed the need for outpatient follow-up and the need to return to the ED if symptoms worsen or if there are any questions or concerns that arise at home.          Clinical Impression     1. Gait disturbance    2. Vision changes        Prescriptions     Discharge Medication List as of 07/02/2017  8:51 AM          Disposition and Follow-up   Disposition: Discharge [1]    Return precautions are specified on After Visit Summary.                 Evern Core I., MD, MPH  07/02/17 [phone]

## 2017-07-02 NOTE — ED Notes
Pt in NAD, ambulating steady in the ER, as per wife and pt's subjective report his ambulating improved since he was picked up by EMS. A/Ox4, no neuro deficits, speech clear, double vision resolved, no unilateral weakness assessed. Assessment conducted [facility] Dr Lurena Nidaonaldson , [facility]. Pt stating this was only about the 5th time he took Ambien, stating he usually takes it around 8pm  and last night took it much later, pt usually walks about 5 miles w the dogs, today was stumbling. Denies any use of ETOH.

## 2019-10-30 ENCOUNTER — Ambulatory Visit: Payer: PRIVATE HEALTH INSURANCE

## 2019-10-30 DIAGNOSIS — H2512 Age-related nuclear cataract, left eye: Secondary | ICD-10-CM

## 2019-10-30 DIAGNOSIS — Z8669 Personal history of other diseases of the nervous system and sense organs: Secondary | ICD-10-CM

## 2019-10-30 DIAGNOSIS — H25013 Cortical age-related cataract, bilateral: Secondary | ICD-10-CM

## 2019-10-30 NOTE — Progress Notes
Assessment and Plan     Problem List        Eye / Vision Problems    1. History of retinal tear, s/p repair, OD    2. 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   Occasionally has to rely on over-the-counter reading glasses.   Patient is interested in cataract surgery to address visual symptoms which   interfere with activities of daily life - trouble with reading, traffic   signs, vision not as clear as it was in the past.    Aim plano with astigmatism management and premium lens implants,   understands eyeglasses may be needed in some situations and conditions.  Plan right eye first, femtosecond laser, PanOptix     There is a reasonable likelihood that removal of cataract and insertion of   intraocular lens implant will improve vision and quality of life. Risks of   surgery, including vision loss, bleeding, infection, retinal problems,   glaucoma, corneal scars, dry eye, need for future operations, and even   rare loss of the eye discussed. Questions and concerns answered and   addressed. Patient would like to proceed with elective cataract surgery   pending medical clearance for monitored intravenous sedation.                       Orders:  Orders Placed This Encounter   ??? OCT MACULA HEIDELBERG OU (BOTH EYES)     Technician Instructions:     Scheduling Instructions:      Patient Instructions:   ??? IOL POWER CALCULATION IOLMASTER OD (RIGHT EYE)     Technician Instructions:     Scheduling Instructions:      Patient Instructions:   ??? IOL POWER CALCULATION IOLMASTER OS (LEFT EYE)     Technician Instructions:     Scheduling Instructions:      Patient Instructions:       Follow ups:  Return for IOL Master, cataract surgery, Femto Laser, Astigmatism Management, RIGHT, first.         I discussed the above assessment and plan with the patient. He had the opportunity to ask questions, and his questions and concerns were addressed. He was reminded to call if there is any significant change or worsening in vision, or to get an evaluation, urgently if appropriate.    Author:   Colen Darling, MD  This note details the assessment and plan of the encounter for this date. For a complete note and record of the encounter, please see the Encounter Summary.

## 2019-11-02 ENCOUNTER — Ambulatory Visit: Payer: PRIVATE HEALTH INSURANCE

## 2019-11-02 DIAGNOSIS — H2513 Age-related nuclear cataract, bilateral: Secondary | ICD-10-CM

## 2019-11-26 ENCOUNTER — Telehealth: Payer: MEDICARE

## 2019-11-26 NOTE — Telephone Encounter
Patient is scheduled for cataract surgery.  He has questions about the lens.  Please contact patient.

## 2019-11-27 NOTE — Telephone Encounter
Spoke with Dr. Alveda Reasons.  Femtosecond laser. Considering PanOptix - he will let us know.

## 2019-12-10 ENCOUNTER — Telehealth: Payer: PRIVATE HEALTH INSURANCE

## 2019-12-10 NOTE — Telephone Encounter
Patient called.  He states he decided on the multifocal lens.

## 2019-12-12 ENCOUNTER — Telehealth: Payer: PRIVATE HEALTH INSURANCE

## 2019-12-12 ENCOUNTER — Ambulatory Visit: Payer: MEDICARE

## 2019-12-12 MED ORDER — PREDNISOLONE ACETATE 1 % OP SUSP
1 [drp] | Freq: Four times a day (QID) | OPHTHALMIC | 1 refills | Status: AC
Start: 2019-12-12 — End: ?

## 2019-12-12 NOTE — Telephone Encounter
Patient is scheduled for cataract surgery right eye with Dr. Lou Miner on 11/22.  Please send drops to pharmacy on file.

## 2019-12-16 NOTE — Telephone Encounter
Thank you     Femtosecond laser?

## 2019-12-17 NOTE — Telephone Encounter
Yes FEMTO

## 2019-12-18 ENCOUNTER — Ambulatory Visit: Payer: MEDICARE

## 2019-12-18 DIAGNOSIS — Z9841 Cataract extraction status, right eye: Secondary | ICD-10-CM

## 2019-12-18 NOTE — Progress Notes
Assessment and Plan     Problem List        Eye / Vision Problems    1. 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 He knows to call if there are any changes. Monitor for now.            2. S/P cataract surgery, FLACS PanOptix OD 12/17/19     Overview      Status post phacoemulsification cataract extraction with intraocular   lens implantation. Tolerating postoperative medications well, will use   prednisolone eye drops as prescribed. Reviewed precautions to reduce   infection risk. Patient knows to call if there are vision changes, pain,   increased redness, etc.    Descemet's folds   Postoperative course reviewed                      Orders:  No orders of the defined types were placed in this encounter.      Follow ups:  Return in about 1 week (around 12/25/2019).         I discussed the above assessment and plan with the patient. He had the opportunity to ask questions, and his questions and concerns were addressed. He was reminded to call if there is any significant change or worsening in vision, or to get an evaluation, urgently if appropriate.    Author:   Colen Darling, MD  This note details the assessment and plan of the encounter for this date. For a complete note and record of the encounter, please see the Encounter Summary.

## 2019-12-27 ENCOUNTER — Ambulatory Visit: Payer: PRIVATE HEALTH INSURANCE

## 2019-12-27 DIAGNOSIS — H53143 Visual discomfort, bilateral: Secondary | ICD-10-CM

## 2019-12-27 DIAGNOSIS — Z9841 Cataract extraction status, right eye: Secondary | ICD-10-CM

## 2019-12-27 NOTE — Progress Notes
Assessment and Plan     Problem List        Eye / Vision Problems    1. 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 He knows to call if there are any changes. Monitor for now.            2. S/P cataract surgery, FLACS PanOptix OD 12/17/19     Overview      Status post phacoemulsification cataract extraction with intraocular   lens implantation. Tolerating postoperative medications well, will use   prednisolone eye drops as prescribed. Reviewed precautions to reduce   infection risk. Patient knows to call if there are vision changes, pain,   increased redness, etc.    Descemet's folds - improving  Postoperative course reviewed    Prednisolone acetate 1% four times per day                       Orders:  Orders Placed This Encounter    OCT MACULA HEIDELBERG OU (BOTH EYES)     Technician Instructions:     Scheduling Instructions:      Patient Instructions:       Follow ups:  Return in about 2 weeks (around 01/10/2020).         I discussed the above assessment and plan with the patient. He had the opportunity to ask questions, and his questions and concerns were addressed. He was reminded to call if there is any significant change or worsening in vision, or to get an evaluation, urgently if appropriate.    Author:   Colen Darling, MD  This note details the assessment and plan of the encounter for this date. For a complete note and record of the encounter, please see the Encounter Summary.

## 2020-01-11 ENCOUNTER — Ambulatory Visit: Payer: MEDICARE

## 2020-01-11 DIAGNOSIS — Z9841 Cataract extraction status, right eye: Secondary | ICD-10-CM

## 2020-01-11 NOTE — Progress Notes
Assessment and Plan     Problem List        Eye / Vision Problems    1. 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 He knows to call if there are any changes. Monitor for now.            2. S/P cataract surgery, FLACS PanOptix OD 12/17/19     Overview      Status post phacoemulsification cataract extraction with intraocular   lens implantation. Tolerating postoperative medications well, will use   prednisolone eye drops as prescribed. Reviewed precautions to reduce   infection risk. Patient knows to call if there are vision changes, pain,   increased redness, etc.    Descemet's folds - improving- very slowly.  Postoperative course reviewed    Decrease Prednisolone acetate 1% to three times a day, recheck 1 month  Preservative-free over-the-counter lubricant eye drops as needed.                       Orders:  No orders of the defined types were placed in this encounter.      Follow ups:  Return in about 1 month (around 02/11/2020).         I discussed the above assessment and plan with the patient. He had the opportunity to ask questions, and his questions and concerns were addressed. He was reminded to call if there is any significant change or worsening in vision, or to get an evaluation, urgently if appropriate.    Author:   Colen Darling, MD  This note details the assessment and plan of the encounter for this date. For a complete note and record of the encounter, please see the Encounter Summary.

## 2020-02-15 ENCOUNTER — Ambulatory Visit: Payer: PRIVATE HEALTH INSURANCE

## 2020-02-15 DIAGNOSIS — H35351 Cystoid macular degeneration, right eye: Secondary | ICD-10-CM

## 2020-02-15 DIAGNOSIS — H53143 Visual discomfort, bilateral: Secondary | ICD-10-CM

## 2020-02-15 MED ORDER — DICLOFENAC SODIUM 0.1 % OP SOLN
1 [drp] | Freq: Four times a day (QID) | OPHTHALMIC | 2 refills | Status: AC
Start: 2020-02-15 — End: ?

## 2020-02-15 MED ORDER — PREDNISOLONE ACETATE 1 % OP SUSP
1 [drp] | Freq: Four times a day (QID) | OPHTHALMIC | 1 refills | Status: AC
Start: 2020-02-15 — End: ?

## 2020-02-15 NOTE — Progress Notes
Assessment and Plan     Problem List        Eye / Vision Problems    1. CME (cystoid macular edema), right     Overview      History of right retinopexy  02/15/2020 start prednisolone acetate 1% and diclofenac                         Orders:  Orders Placed This Encounter    OCT MACULA HEIDELBERG OU (BOTH EYES)     Technician Instructions:     Scheduling Instructions:      Patient Instructions:    diclofenac 0.1% ophthalmic solution     Sig: Place 1 drop into the right eye four (4) times daily.     Dispense:  5 mL     Refill:  2    prednisoLONE acetate 1% ophthalmic suspension     Sig: Place 1 drop into the right eye four (4) times daily Use four times a day  For two weeks then twice a day for two weeks then stop.     Dispense:  10 mL     Refill:  1       Follow ups:  Return in about 4 weeks (around 03/14/2020) for recheck.         I discussed the above assessment and plan with the patient. He had the opportunity to ask questions, and his questions and concerns were addressed. He was reminded to call if there is any significant change or worsening in vision, or to get an evaluation, urgently if appropriate.    Author:   Colen Darling, MD  This note details the assessment and plan of the encounter for this date. For a complete note and record of the encounter, please see the Encounter Summary.

## 2020-03-13 ENCOUNTER — Ambulatory Visit: Payer: PRIVATE HEALTH INSURANCE

## 2020-03-13 DIAGNOSIS — Z9841 Cataract extraction status, right eye: Secondary | ICD-10-CM

## 2020-03-13 NOTE — Progress Notes
Assessment and Plan     Problem List        Eye / Vision Problems    1. 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 He knows to call if there are any changes. Monitor for now.            2. CME (cystoid macular edema), right     Overview      History of right retinopexy  02/15/2020 start prednisolone acetate 1% and diclofenac     1. Prednisolone acetate 1% four times per day   2. Diclofenac four times per day          3. S/P cataract surgery, FLACS PanOptix OD 12/17/19     Overview      Status post phacoemulsification cataract extraction with intraocular   lens implantation. Tolerating postoperative medications well, will use   prednisolone eye drops as prescribed. Reviewed precautions to reduce   infection risk. Patient knows to call if there are vision changes, pain,   increased redness, etc.    Postoperative course reviewed    Preservative-free over-the-counter lubricant eye drops as needed.                       Orders:  No orders of the defined types were placed in this encounter.      Follow ups:  Return in about 3 weeks (around 04/03/2020) for MAC OCT.         I discussed the above assessment and plan with the patient. He had the opportunity to ask questions, and his questions and concerns were addressed. He was reminded to call if there is any significant change or worsening in vision, or to get an evaluation, urgently if appropriate.    Author:   Clinton Sawyer, MD  This note details the assessment and plan of the encounter for this date. For a complete note and record of the encounter, please see the Encounter Summary.

## 2020-04-15 ENCOUNTER — Ambulatory Visit: Payer: MEDICARE

## 2020-04-15 ENCOUNTER — Ambulatory Visit: Payer: PRIVATE HEALTH INSURANCE

## 2020-04-15 MED ORDER — PREDNISOLONE ACETATE 1 % OP SUSP
1 [drp] | Freq: Four times a day (QID) | OPHTHALMIC | 1 refills | Status: AC
Start: 2020-04-15 — End: ?

## 2020-04-15 NOTE — Progress Notes
Assessment and Plan     Problem List        Eye / Vision Problems    1. 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 He knows to call if there are any changes. Monitor for now.            2. CME (cystoid macular edema), right     Overview      History of right retinopexy  02/15/2020 start prednisolone acetate 1% and diclofenac     1. Prednisolone acetate 1% four times per day   2. Diclofenac four times per day     Improving. Needs longer treatment.                      Orders:  Orders Placed This Encounter    OCT MACULA HEIDELBERG OU (BOTH EYES)     Technician Instructions:     Scheduling Instructions:      Patient Instructions:    prednisoLONE acetate 1% ophthalmic suspension     Sig: Place 1 drop into the right eye four (4) times daily.     Dispense:  10 mL     Refill:  1       Follow ups:  Return in about 1 month (around 05/16/2020) for MAC OCT.         I discussed the above assessment and plan with the patient. He had the opportunity to ask questions, and his questions and concerns were addressed. He was reminded to call if there is any significant change or worsening in vision, or to get an evaluation, urgently if appropriate.    Author:   Clinton Sawyer, MD  This note details the assessment and plan of the encounter for this date. For a complete note and record of the encounter, please see the Encounter Summary.

## 2020-06-12 ENCOUNTER — Ambulatory Visit: Payer: PRIVATE HEALTH INSURANCE

## 2020-06-12 ENCOUNTER — Ambulatory Visit: Payer: MEDICARE

## 2020-06-16 ENCOUNTER — Ambulatory Visit: Payer: MEDICARE

## 2020-06-16 DIAGNOSIS — Z9841 Cataract extraction status, right eye: Secondary | ICD-10-CM

## 2020-06-16 DIAGNOSIS — H26491 Other secondary cataract, right eye: Secondary | ICD-10-CM

## 2020-06-16 NOTE — Progress Notes
Assessment and Plan     Problem List        Eye / Vision Problems    1. 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 He knows to call if there are any changes. Monitor for now.              2. CME (cystoid macular edema), right     Overview      History of right retinopexy  06/16/2020 resolved            3. PCO (posterior capsular opacification), right     Overview      Minimal symptoms   Monitor            4. S/P cataract surgery, FLACS [facility] OD 12/17/19     Overview      Preservative-free over-the-counter lubricant eye drops as needed.                         Orders:  No orders of the defined types were placed in this encounter.      Follow ups:  Return in about 1 year (around 06/16/2021).         I discussed the above assessment and plan with the patient. He had the opportunity to ask questions, and his questions and concerns were addressed. He was reminded to call if there is any significant change or worsening in vision, or to get an evaluation, urgently if appropriate.    Author:   Colen Darling, MD  This note details the assessment and plan of the encounter for this date. For a complete note and record of the encounter, please see the Encounter Summary.

## 2020-10-27 ENCOUNTER — Ambulatory Visit: Payer: MEDICARE
